# Patient Record
Sex: Female | Born: 1970 | Race: Black or African American | Marital: Single | Smoking: Never smoker
Health system: Southern US, Community
[De-identification: ages and names within clinical notes are randomized; demographics above are authoritative.]

## PROBLEM LIST (undated history)

## (undated) DIAGNOSIS — T7840XA Allergy, unspecified, initial encounter: Secondary | ICD-10-CM

## (undated) DIAGNOSIS — M1990 Unspecified osteoarthritis, unspecified site: Secondary | ICD-10-CM

## (undated) DIAGNOSIS — F419 Anxiety disorder, unspecified: Secondary | ICD-10-CM

## (undated) DIAGNOSIS — F32A Depression, unspecified: Secondary | ICD-10-CM

## (undated) DIAGNOSIS — J45909 Unspecified asthma, uncomplicated: Secondary | ICD-10-CM

## (undated) DIAGNOSIS — J302 Other seasonal allergic rhinitis: Secondary | ICD-10-CM

## (undated) HISTORY — PX: LEEP: SHX91

## (undated) HISTORY — DX: Depression, unspecified: F32.A

## (undated) HISTORY — DX: Allergy, unspecified, initial encounter: T78.40XA

## (undated) HISTORY — DX: Unspecified osteoarthritis, unspecified site: M19.90

## (undated) HISTORY — PX: LAPAROSCOPIC GASTRIC SLEEVE RESECTION: SHX5895

## (undated) HISTORY — PX: CERVICAL FUSION: SHX112

## (undated) HISTORY — DX: Anxiety disorder, unspecified: F41.9

## (undated) HISTORY — PX: WISDOM TOOTH EXTRACTION: SHX21

---

## 2013-04-29 ENCOUNTER — Encounter: Payer: Self-pay | Admitting: Physical Medicine & Rehabilitation

## 2013-05-09 ENCOUNTER — Encounter (HOSPITAL_COMMUNITY): Payer: Self-pay | Admitting: Emergency Medicine

## 2013-05-09 ENCOUNTER — Emergency Department (HOSPITAL_COMMUNITY): Payer: Medicare Other

## 2013-05-09 ENCOUNTER — Emergency Department (HOSPITAL_COMMUNITY)
Admission: EM | Admit: 2013-05-09 | Discharge: 2013-05-09 | Disposition: A | Discharge status: Home / Self Care | Payer: Medicare Other | Attending: Emergency Medicine | Admitting: Emergency Medicine

## 2013-05-09 DIAGNOSIS — N938 Other specified abnormal uterine and vaginal bleeding: Secondary | ICD-10-CM | POA: Insufficient documentation

## 2013-05-09 DIAGNOSIS — R109 Unspecified abdominal pain: Secondary | ICD-10-CM

## 2013-05-09 DIAGNOSIS — J309 Allergic rhinitis, unspecified: Secondary | ICD-10-CM | POA: Insufficient documentation

## 2013-05-09 DIAGNOSIS — R1031 Right lower quadrant pain: Secondary | ICD-10-CM | POA: Insufficient documentation

## 2013-05-09 DIAGNOSIS — Z3202 Encounter for pregnancy test, result negative: Secondary | ICD-10-CM | POA: Insufficient documentation

## 2013-05-09 DIAGNOSIS — N949 Unspecified condition associated with female genital organs and menstrual cycle: Secondary | ICD-10-CM | POA: Insufficient documentation

## 2013-05-09 DIAGNOSIS — J45909 Unspecified asthma, uncomplicated: Secondary | ICD-10-CM | POA: Insufficient documentation

## 2013-05-09 DIAGNOSIS — Z79899 Other long term (current) drug therapy: Secondary | ICD-10-CM | POA: Insufficient documentation

## 2013-05-09 DIAGNOSIS — Z882 Allergy status to sulfonamides status: Secondary | ICD-10-CM | POA: Insufficient documentation

## 2013-05-09 DIAGNOSIS — R112 Nausea with vomiting, unspecified: Secondary | ICD-10-CM | POA: Insufficient documentation

## 2013-05-09 HISTORY — DX: Unspecified asthma, uncomplicated: J45.909

## 2013-05-09 HISTORY — DX: Other seasonal allergic rhinitis: J30.2

## 2013-05-09 LAB — CBC WITH DIFFERENTIAL/PLATELET
Eosinophils Relative: 1 % (ref 0–5)
Hemoglobin: 12.7 g/dL (ref 12.0–15.0)
Lymphocytes Relative: 14 % (ref 12–46)
Lymphs Abs: 1 10*3/uL (ref 0.7–4.0)
MCH: 27.2 pg (ref 26.0–34.0)
MCV: 82.4 fL (ref 78.0–100.0)
Monocytes Relative: 7 % (ref 3–12)
Neutrophils Relative %: 78 % — ABNORMAL HIGH (ref 43–77)
Platelets: 224 10*3/uL (ref 150–400)
RBC: 4.67 MIL/uL (ref 3.87–5.11)
WBC: 7 10*3/uL (ref 4.0–10.5)

## 2013-05-09 LAB — URINALYSIS, ROUTINE W REFLEX MICROSCOPIC
Ketones, ur: 15 mg/dL — AB
Nitrite: NEGATIVE
Protein, ur: 30 mg/dL — AB
Urobilinogen, UA: 1 mg/dL (ref 0.0–1.0)

## 2013-05-09 LAB — COMPREHENSIVE METABOLIC PANEL
ALT: 14 U/L (ref 0–35)
Alkaline Phosphatase: 59 U/L (ref 39–117)
BUN: 10 mg/dL (ref 6–23)
CO2: 24 mEq/L (ref 19–32)
GFR calc Af Amer: >90 mL/min (ref >90)
GFR calc non Af Amer: 88 mL/min — ABNORMAL LOW (ref >90)
Glucose, Bld: 100 mg/dL — ABNORMAL HIGH (ref 70–99)
Potassium: 3.9 mEq/L (ref 3.5–5.1)
Sodium: 138 mEq/L (ref 135–145)

## 2013-05-09 MED ORDER — SODIUM CHLORIDE 0.9 % IV BOLUS (SEPSIS)
1000.0000 mL | Freq: Once | INTRAVENOUS | Status: AC
  Administered 2013-05-09: 1000 mL via INTRAVENOUS

## 2013-05-09 NOTE — ED Provider Notes (Signed)
Medical screening examination/treatment/procedure(s) were performed by non-physician practitioner and as supervising physician I was immediately available for consultation/collaboration.  Dillinger Aston R. Garrie Woodin, MD 05/09/13 2348 

## 2013-05-09 NOTE — ED Provider Notes (Signed)
Care assumed from Dr. Anitra Lauth at shift change. Awaiting pelvic US to r/o any evidence of ovarian torsion. If [place] normal, patient discharged home with gyn/PCP f/u. 5:51 PM [place] normal. Patient stable for discharge home. Abdomen soft was very mild tenderness in right pelvic region. No rigidity or guarding. F/u with gyn. Return precautions discussed. Patient states understanding of plan and is agreeable.  Trevor Mace, PA-C 05/09/13 1753

## 2013-05-09 NOTE — ED Notes (Signed)
I gave the patient a warm blanket. 

## 2013-05-09 NOTE — ED Notes (Signed)
Pt c/o right lower quadrant abdominal pain with N/V. Pt reports vomited 6 times today. Pt reports decrease urine output today.

## 2013-05-09 NOTE — ED Provider Notes (Signed)
History    CSN: [id number] Arrival date & time 05/09/13  1233  First MD Initiated Contact with Patient 05/09/13 1247     Chief Complaint  Patient presents with  . Abdominal Pain    Right lower  . Emesis   (Consider location/radiation/quality/duration/timing/severity/associated sxs/prior Treatment) Patient is a 42 y.o. female presenting with abdominal pain and vomiting. The history is provided by the patient.  Abdominal Pain This is a new problem. The current episode started 6 to 12 hours ago. The problem occurs constantly. Progression since onset: almost completely resolved. Associated symptoms include abdominal pain. Pertinent negatives include no chest pain and no shortness of breath. Associated symptoms comments: No fever, diarrhea or constipation.  6 episodes of vomiting every few min.  Last emesis was about 1 hr pta. Nothing aggravates the symptoms. Nothing relieves the symptoms. She has tried nothing for the symptoms. The treatment provided significant relief.  Emesis Associated symptoms: abdominal pain   Associated symptoms: no diarrhea    Past Medical History  Diagnosis Date  . Asthma    Past Surgical History  Procedure Laterality Date  . Laparoscopic gastric sleeve resection    . Cervical fusion     No family history on file. History  Substance Use Topics  . Smoking status: Never Smoker   . Smokeless tobacco: Not on file  . Alcohol Use: Yes   OB History   Grav Para Term Preterm Abortions TAB SAB Ect Mult Living                 Review of Systems  Constitutional: Negative for fever.  Respiratory: Negative for shortness of breath.   Cardiovascular: Negative for chest pain.  Gastrointestinal: Positive for nausea, vomiting and abdominal pain. Negative for diarrhea, constipation and blood in stool.  Genitourinary: Positive for vaginal bleeding. Negative for dysuria and menstrual problem.  All other systems reviewed and are negative.    Allergies  Sulfa  antibiotics  Home Medications  No current outpatient prescriptions on file. BP 115/83  Pulse 67  Temp(Src) 98.3 F (36.8 C) (Oral)  Resp 20  Ht 5' 6.5" (1.689 m)  Wt 183 lb (83.008 kg)  BMI 29.1 kg/m2  SpO2 100%  LMP 05/06/2013 Physical Exam  Nursing note and vitals reviewed. Constitutional: She is oriented to person, place, and time. She appears well-developed and well-nourished. No distress.  HENT:  Head: Normocephalic and atraumatic.  Mouth/Throat: Oropharynx is clear and moist.  Eyes: Conjunctivae and EOM are normal. Pupils are equal, round, and reactive to light.  Neck: Normal range of motion. Neck supple.  Cardiovascular: Normal rate, regular rhythm and intact distal pulses.   No murmur heard. Pulmonary/Chest: Effort normal and breath sounds normal. No respiratory distress. She has no wheezes. She has no rales.  Abdominal: Soft. Normal appearance. She exhibits no distension. There is tenderness. There is no rebound and no guarding.    Mild tenderness in the right pelvic region  Genitourinary: Uterus normal. Cervix exhibits no motion tenderness, no discharge and no friability. Right adnexum displays tenderness. Right adnexum displays no mass and no fullness. Left adnexum displays no mass, no tenderness and no fullness. There is bleeding around the vagina.  Musculoskeletal: Normal range of motion. She exhibits no edema and no tenderness.  Neurological: She is alert and oriented to person, place, and time.  Skin: Skin is warm and dry. No rash noted. No erythema.  Psychiatric: She has a normal mood and affect. Her behavior is normal.  ED Course  Procedures (including critical care time) Labs Reviewed  CBC WITH DIFFERENTIAL - Abnormal; Notable for the following:    Neutrophils Relative % 78 (*)    All other components within normal limits  COMPREHENSIVE METABOLIC PANEL - Abnormal; Notable for the following:    Glucose, Bld 100 (*)    GFR calc non Af Amer 88 (*)    All  other components within normal limits  URINALYSIS, ROUTINE W REFLEX MICROSCOPIC  POCT PREGNANCY, URINE   No results found. No diagnosis found.  MDM   Patient presents to 2 right lower quadrant pain that was severe earlier today with vomiting and is now almost completely resolved. Denies fever or bowel symptoms. Patient has a prior history of a gastric sleeve with no complications and has done well for the last several years but no other abdominal surgeries. Pelvic exam with tenderness over the right ovary a.m. moderate amount of blood in the vaginal vault the patient is currently on menses. Feel most likely the patient's symptoms. Cystoscopy possible intermittent torsion but low concern for appendicitis at this time his pain is almost completely resolved and she displays no symptoms concerning for perforated appendicitis. Decreased bowel sounds are to an acute abdominal series to rule out obstruction. CBC and CMP within normal limits. Urine pregnancy negative.  Patient given IV fluids but currently is not requiring any medication for pain.  Gwyneth Sprout, MD 05/10/13 [id number]

## 2013-06-21 ENCOUNTER — Ambulatory Visit (HOSPITAL_BASED_OUTPATIENT_CLINIC_OR_DEPARTMENT_OTHER): Payer: Medicare Other | Admitting: Physical Medicine & Rehabilitation

## 2013-06-21 ENCOUNTER — Encounter: Payer: Self-pay | Admitting: Physical Medicine & Rehabilitation

## 2013-06-21 ENCOUNTER — Encounter: Payer: Medicare Other | Attending: Physical Medicine & Rehabilitation

## 2013-06-21 VITALS — BP 115/75 | HR 84 | Resp 14 | Ht 66.0 in | Wt 194.0 lb

## 2013-06-21 DIAGNOSIS — M961 Postlaminectomy syndrome, not elsewhere classified: Secondary | ICD-10-CM | POA: Insufficient documentation

## 2013-06-21 DIAGNOSIS — Z981 Arthrodesis status: Secondary | ICD-10-CM | POA: Insufficient documentation

## 2013-06-21 DIAGNOSIS — Z0000 Encounter for general adult medical examination without abnormal findings: Secondary | ICD-10-CM | POA: Insufficient documentation

## 2013-06-21 DIAGNOSIS — R259 Unspecified abnormal involuntary movements: Secondary | ICD-10-CM | POA: Insufficient documentation

## 2013-06-21 DIAGNOSIS — M542 Cervicalgia: Secondary | ICD-10-CM | POA: Insufficient documentation

## 2013-06-21 DIAGNOSIS — G243 Spasmodic torticollis: Secondary | ICD-10-CM

## 2013-06-21 NOTE — Progress Notes (Signed)
Subjective:    Patient ID: Christine Palmer, female    DOB: Jun 29, 1971, 42 y.o.   MRN: [id number]  HPI Chief complaint is neck pain History greater than 6 year history of neck pain. Underwent anterior cervical decompression and fusion August 8 C4-C7 levels. Postoperatively developed torticollis. Was under the care of Dr. Karren Burly at the [facility] [place], [place].  Has undergone botulinum toxin injections on multiple occasions starting in 2009. Last injection was July of 2014. Has received a total of 15 injections. Last injection included the right semisoft by mouth At this 50 units rights plan he is This 20 units right knee greater scapula 30 units left trapezius 75 units left minus This 50 units left levator scapula 75 units.  Patient indicates good results with this injection protocol  Post procedure occasionally with pain. Has had to use tramadol on occasion for pain but not regular basis. Takes Zanaflex 4 mg 3 times per day  PMH: Morbid Obesity s/p gastric sleeve procedure  Social: Moved to [place] with mother to be near sister Pain Inventory Average Pain 6 Pain Right Now 4 My pain is constant, dull and aching  In the last 24 hours, has pain interfered with the following? General activity 6 Relation with others 2 Enjoyment of life 2 What TIME of day is your pain at its worst? constant Sleep (in general) Poor  Pain is worse with: sitting, standing and some activites Pain improves with: rest and medication Relief from Meds: 8  Mobility do you drive?  yes  Function disabled: date disabled 2008  Neuro/Psych weakness numbness tingling spasms  Prior Studies x-rays CT/MRI  Physicians involved in your care Any changes since last visit?  no   Family History  Problem Relation Age of Onset  . Heart disease Mother   . Hypertension Mother   . COPD Mother   . Alcohol abuse Father   . Stroke Father   . Hypertension Father   . Diabetes Father     History   Social History  . Marital Status: Single    Spouse Name: N/A    Number of Children: N/A  . Years of Education: N/A   Social History Main Topics  . Smoking status: Never Smoker   . Smokeless tobacco: None  . Alcohol Use: Yes  . Drug Use: No  . Sexual Activity: None   Other Topics Concern  . None   Social History Narrative  . None   Past Surgical History  Procedure Laterality Date  . Laparoscopic gastric sleeve resection    . Cervical fusion     Past Medical History  Diagnosis Date  . Asthma   . Seasonal allergies    BP 115/75  Pulse 84  Resp 14  Ht 5\' 6"  (1.676 m)  Wt 194 lb (87.998 kg)  BMI 31.33 kg/m2  SpO2 100%  LMP 06/07/2013     Review of Systems  Neurological: Positive for weakness and numbness.       Tingling, spasm  All other systems reviewed and are negative.       Objective:   Physical Exam  Nursing note and vitals reviewed. Constitutional: She is oriented to person, place, and time. She appears well-developed and well-nourished.  HENT:  Head: Normocephalic and atraumatic.  Eyes: Conjunctivae and EOM are normal. Pupils are equal, round, and reactive to light.  Musculoskeletal:       Cervical back: She exhibits decreased range of motion.  No tenderness to palpation along  the cervical paraspinal or para scapular muscle groups  Neurological: She is alert and oriented to person, place, and time. She has normal strength and normal reflexes. She displays no atrophy and no tremor. A sensory deficit is present. She exhibits abnormal muscle tone. She displays a negative Romberg sign. Gait normal.  Decreased sensation left second third and fourth digit To light Decreased left C5 and C6 dermatome to pinprick  Reduced cervical range of motion approximately 0 25% for flexion extension lateral rotation and bending.  At rest 5 tilt lateralcollis to right side   Psychiatric: She has a normal mood and affect.          Assessment &  Plan:  1. 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 Has responded well to botulinum toxin injections at a frequency of every 3 months. Doses have been stable over the past 4 years. We discussed that I would continue to follow for the Zanaflex 4 mg 3 times per day We can also prescribed as needed tramadol and Valium  Will set up next cervical injection the end of September.Not prior to 9/25  2. 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 Establishing medical care, patient is new to this area, made referral for primary care physician. She prefers female physician.

## 2013-06-21 NOTE — Patient Instructions (Signed)
Dr. Asencion Partridge is the physician we have referred you to.

## 2013-07-20 DIAGNOSIS — F339 Major depressive disorder, recurrent, unspecified: Secondary | ICD-10-CM | POA: Insufficient documentation

## 2013-07-20 DIAGNOSIS — J309 Allergic rhinitis, unspecified: Secondary | ICD-10-CM | POA: Insufficient documentation

## 2013-07-20 DIAGNOSIS — Z9889 Other specified postprocedural states: Secondary | ICD-10-CM | POA: Insufficient documentation

## 2013-07-30 ENCOUNTER — Encounter: Payer: Medicare Other | Attending: Physical Medicine & Rehabilitation

## 2013-07-30 ENCOUNTER — Encounter: Payer: Self-pay | Admitting: Physical Medicine & Rehabilitation

## 2013-07-30 ENCOUNTER — Ambulatory Visit (HOSPITAL_BASED_OUTPATIENT_CLINIC_OR_DEPARTMENT_OTHER): Payer: Medicare Other | Admitting: Physical Medicine & Rehabilitation

## 2013-07-30 VITALS — BP 109/69 | HR 70 | Resp 14 | Ht 67.0 in | Wt 199.6 lb

## 2013-07-30 DIAGNOSIS — G243 Spasmodic torticollis: Secondary | ICD-10-CM

## 2013-07-30 DIAGNOSIS — M961 Postlaminectomy syndrome, not elsewhere classified: Secondary | ICD-10-CM | POA: Insufficient documentation

## 2013-07-30 DIAGNOSIS — Z981 Arthrodesis status: Secondary | ICD-10-CM | POA: Insufficient documentation

## 2013-07-30 DIAGNOSIS — M542 Cervicalgia: Secondary | ICD-10-CM | POA: Insufficient documentation

## 2013-07-30 DIAGNOSIS — R259 Unspecified abnormal involuntary movements: Secondary | ICD-10-CM | POA: Insufficient documentation

## 2013-07-30 MED ORDER — TIZANIDINE HCL 4 MG PO TABS: 4.0000 mg | ORAL_TABLET | Freq: Two times a day (BID) | ORAL | Status: DC

## 2013-07-30 NOTE — Patient Instructions (Signed)

## 2013-07-30 NOTE — Progress Notes (Signed)
Diagnosis: Cervical dystonia Procedure: Botulinum toxin injection under needle EMG guidance Indication: Cervical dystonia and pain which has been relieved for greater than 3 months with previous botulinum toxin injections.  Informed consent was obtained after describing risks and benefits of the procedure with the patient. This includes bleeding bruising infection swallowing disorder as well as allergy or intolerance to botulinum toxin. She elects to proceed and has given written consent. RE MS form reviewed and signed  Dilution 50 units per cc on the left side 25 units per cc on the right side  300 Units total injected  Right Semispinalis capitus 50 units into 4 sites RIght Splenius Capitus 25 units into 2 sites Right Levator scapulae 25 units into 2 sites  Left Trapezius 75 Units into 3 sites Left Levator scapulae 75 Units into 3 sites Left Splenius capitus 50 units into 2 sites  Patient tolerated procedure well Post procedure instructions given Return to clinic 6 weeks for reevaluation

## 2013-08-06 ENCOUNTER — Telehealth: Payer: Self-pay

## 2013-08-06 MED ORDER — TIZANIDINE HCL 4 MG PO TABS: 4.0000 mg | ORAL_TABLET | Freq: Two times a day (BID) | ORAL | Status: DC

## 2013-08-06 NOTE — Telephone Encounter (Signed)
Patient called requesting zanaflex be sent to [facility] not [facility].  New script was escribed.  Patient aware.

## 2013-09-06 ENCOUNTER — Encounter: Payer: Medicare Other | Attending: Physical Medicine & Rehabilitation

## 2013-09-06 ENCOUNTER — Ambulatory Visit (HOSPITAL_BASED_OUTPATIENT_CLINIC_OR_DEPARTMENT_OTHER): Payer: Medicare Other | Admitting: Physical Medicine & Rehabilitation

## 2013-09-06 ENCOUNTER — Encounter: Payer: Self-pay | Admitting: Physical Medicine & Rehabilitation

## 2013-09-06 VITALS — BP 108/75 | HR 70 | Resp 14 | Ht 66.5 in | Wt 197.6 lb

## 2013-09-06 DIAGNOSIS — M961 Postlaminectomy syndrome, not elsewhere classified: Secondary | ICD-10-CM

## 2013-09-06 DIAGNOSIS — G243 Spasmodic torticollis: Secondary | ICD-10-CM

## 2013-09-06 DIAGNOSIS — Z981 Arthrodesis status: Secondary | ICD-10-CM | POA: Insufficient documentation

## 2013-09-06 DIAGNOSIS — R259 Unspecified abnormal involuntary movements: Secondary | ICD-10-CM | POA: Insufficient documentation

## 2013-09-06 DIAGNOSIS — M542 Cervicalgia: Secondary | ICD-10-CM | POA: Insufficient documentation

## 2013-09-06 NOTE — Patient Instructions (Signed)
Next appt for repeat injection Continue neck Range of motion exercise

## 2013-09-06 NOTE — Progress Notes (Signed)
Subjective:    Patient ID: Christine Palmer, female    DOB: 22-Jun-1971, 42 y.o.   MRN: [id number]  HPI 07/30/2013 Botox injection with following treatment  Dilution 50 units per cc on the left side 25 units per cc on the right side  300 Units total injected  Right Semispinalis capitus 50 units into 4 sites RIght Splenius Capitus 25 units into 2 sites Right Levator scapulae 25 units into 2 sites  Left Trapezius 75 Units into 3 sites Left Levator scapulae 75 Units into 3 sites Left Splenius capitus 50 units into 2 sites  Patient is able to turn her head better. Pain levels are reduced. Last set of injections were as helpful as previous once.   Pain Inventory Average Pain 6 Pain Right Now 5 My pain is constant, dull, tingling and aching  In the last 24 hours, has pain interfered with the following? General activity 3 Relation with others 0 Enjoyment of life 3 What TIME of day is your pain at its worst? morning, night Sleep (in general) Fair  Pain is worse with: bending, sitting, standing and some activites Pain improves with: rest, pacing activities and medication Relief from Meds: 5  Mobility walk without assistance ability to climb steps?  yes do you drive?  yes Do you have any goals in this area?  no  Function disabled: date disabled 2010 Do you have any goals in this area?  no  Neuro/Psych weakness numbness tingling spasms depression anxiety  Prior Studies Any changes since last visit?  no  Physicians involved in your care Any changes since last visit?  no   Family History  Problem Relation Age of Onset  . Heart disease Mother   . Hypertension Mother   . COPD Mother   . Alcohol abuse Father   . Stroke Father   . Hypertension Father   . Diabetes Father    History   Social History  . Marital Status: Single    Spouse Name: N/A    Number of Children: N/A  . Years of Education: N/A   Social History Main Topics  . Smoking status: Never  Smoker   . Smokeless tobacco: None  . Alcohol Use: Yes  . Drug Use: No  . Sexual Activity: None   Other Topics Concern  . None   Social History Narrative  . None   Past Surgical History  Procedure Laterality Date  . Laparoscopic gastric sleeve resection    . Cervical fusion     Past Medical History  Diagnosis Date  . Asthma   . Seasonal allergies    BP 108/75  Pulse 70  Resp 14  Ht 5' 6.5" (1.689 m)  Wt 197 lb 9.6 oz (89.631 kg)  BMI 31.42 kg/m2  SpO2 100%  LMP 08/30/2013      Review of Systems  Musculoskeletal: Positive for neck pain.  Neurological: Positive for weakness and numbness.       Spasms  Psychiatric/Behavioral: Positive for dysphoric mood. The patient is nervous/anxious.   All other systems reviewed and are negative.       Objective:   Physical Exam   Head: Normocephalic and atraumatic.  Eyes: Conjunctivae and EOM are normal. Pupils are equal, round, and reactive to light.  Musculoskeletal:       Cervical back: She exhibits decreased range of motion.  Mild tenderness to palpation along the cervical paraspinal or para scapular muscle groups  Neurological: She is alert and oriented to person, place, and  time. She has normal strength and normal reflexes. She displays no atrophy and no tremor. A sensory deficit is present. She exhibits abnormal muscle tone. She displays a negative Romberg sign. Gait normal.  Decreased sensation left second third and fourth digit To light Decreased left C5 and C6 dermatome to pinprick  Reduced cervical range of motion approximately 0 25% for flexion extension lateral rotation and bending.  At rest NO  lateralcollis to right side     Assessment & Plan:  1. 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 Has responded well to botulinum toxin injections at a frequency of every 3 months. Doses have been stable over the past 4 years. We discussed that I  would continue to follow for the Zanaflex 4 mg 3 times per day We can also prescribed as needed tramadol and Valium  Will set up next cervical injection the end of december.  2. 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

## 2013-10-14 ENCOUNTER — Telehealth: Payer: Self-pay

## 2013-10-14 NOTE — Telephone Encounter (Signed)
Not sure what kind of form.  I don't think I can justify a parking tag

## 2013-10-14 NOTE — Telephone Encounter (Signed)
Patient is requesting a handicap form filled out.

## 2013-10-15 NOTE — Telephone Encounter (Signed)
Patient says she has had a handicap tag for some time now due to her disabilities, she request reconsideration.  Please advise.

## 2013-10-15 NOTE — Telephone Encounter (Signed)
From my records I do not see a reason. I can re examine her next visit

## 2013-10-15 NOTE — Telephone Encounter (Signed)
Patient informed we would not give handicap tag.

## 2013-11-02 ENCOUNTER — Encounter: Payer: Self-pay | Admitting: Physical Medicine & Rehabilitation

## 2013-11-02 ENCOUNTER — Ambulatory Visit (HOSPITAL_BASED_OUTPATIENT_CLINIC_OR_DEPARTMENT_OTHER): Payer: Medicare Other | Admitting: Physical Medicine & Rehabilitation

## 2013-11-02 ENCOUNTER — Encounter: Payer: Medicare Other | Attending: Physical Medicine & Rehabilitation

## 2013-11-02 VITALS — BP 124/78 | HR 86 | Resp 14 | Ht 66.5 in | Wt 199.0 lb

## 2013-11-02 DIAGNOSIS — G243 Spasmodic torticollis: Secondary | ICD-10-CM

## 2013-11-02 DIAGNOSIS — M961 Postlaminectomy syndrome, not elsewhere classified: Secondary | ICD-10-CM | POA: Insufficient documentation

## 2013-11-02 DIAGNOSIS — Z981 Arthrodesis status: Secondary | ICD-10-CM | POA: Insufficient documentation

## 2013-11-02 DIAGNOSIS — R259 Unspecified abnormal involuntary movements: Secondary | ICD-10-CM | POA: Insufficient documentation

## 2013-11-02 DIAGNOSIS — M542 Cervicalgia: Secondary | ICD-10-CM | POA: Insufficient documentation

## 2013-11-02 NOTE — Progress Notes (Signed)
Diagnosis: Cervical dystonia Procedure: Botulinum toxin injection under needle EMG guidance Indication: Cervical dystonia and pain which has been relieved for greater than 3 months with previous botulinum toxin injections.  Informed consent was obtained after describing risks and benefits of the procedure with the patient. This includes bleeding bruising infection swallowing disorder as well as allergy or intolerance to botulinum toxin. She elects to proceed and has given written consent. RE MS form reviewed and signed  Dilution 50 units per cc   300 Units total injected  Right Semispinalis capitus 50 units into 4 sites RIght Splenius Capitus 25 units into 2 sites Right Levator scapulae 25 units into 2 sites  Left Trapezius 75 Units into 3 sites Left Levator scapulae 75 Units into 3 sites Left Splenius capitus 50 units into 2 sites  Patient tolerated procedure well Post procedure instructions given Return to clinic 6 weeks for reevaluation 

## 2013-11-02 NOTE — Patient Instructions (Signed)
My treatments are limited to the neck and no restrictions would qualify you for a handicapped parking sticker

## 2014-01-18 ENCOUNTER — Other Ambulatory Visit: Payer: Self-pay | Admitting: Orthopaedic Surgery

## 2014-01-18 DIAGNOSIS — M545 Low back pain, unspecified: Secondary | ICD-10-CM

## 2014-01-26 ENCOUNTER — Ambulatory Visit
Admission: RE | Admit: 2014-01-26 | Discharge: 2014-01-26 | Disposition: A | Discharge status: Home / Self Care | Payer: Medicare Other | Source: Ambulatory Visit | Attending: Orthopaedic Surgery | Admitting: Orthopaedic Surgery

## 2014-01-26 DIAGNOSIS — M545 Low back pain, unspecified: Secondary | ICD-10-CM

## 2014-01-27 ENCOUNTER — Ambulatory Visit (HOSPITAL_BASED_OUTPATIENT_CLINIC_OR_DEPARTMENT_OTHER): Payer: Medicare Other | Admitting: Physical Medicine & Rehabilitation

## 2014-01-27 ENCOUNTER — Encounter: Payer: Self-pay | Admitting: Physical Medicine & Rehabilitation

## 2014-01-27 ENCOUNTER — Encounter: Payer: Medicare Other | Attending: Physical Medicine & Rehabilitation

## 2014-01-27 VITALS — BP 119/80 | HR 79 | Resp 14 | Ht 67.0 in | Wt 204.6 lb

## 2014-01-27 DIAGNOSIS — R259 Unspecified abnormal involuntary movements: Secondary | ICD-10-CM | POA: Insufficient documentation

## 2014-01-27 DIAGNOSIS — G243 Spasmodic torticollis: Secondary | ICD-10-CM

## 2014-01-27 DIAGNOSIS — Z981 Arthrodesis status: Secondary | ICD-10-CM | POA: Insufficient documentation

## 2014-01-27 DIAGNOSIS — M961 Postlaminectomy syndrome, not elsewhere classified: Secondary | ICD-10-CM | POA: Insufficient documentation

## 2014-01-27 DIAGNOSIS — M542 Cervicalgia: Secondary | ICD-10-CM | POA: Insufficient documentation

## 2014-01-27 NOTE — Patient Instructions (Signed)
OnabotulinumtoxinA injection (Medical Use) What is this medicine? ONABOTULINUMTOXINA (o na BOTT you lye num tox in eh) is a neuro-muscular blocker. This medicine is used to treat crossed eyes, eyelid spasms, severe neck muscle spasms, and elbow, wrist, and finger muscle spasms. It is also used to treat excessive underarm sweating, to prevent chronic migraine headaches, and to treat loss of bladder control due to neurologic conditions such as multiple sclerosis or spinal cord injury. This medicine may be used for other purposes; ask your health care provider or pharmacist if you have questions. COMMON BRAND NAME(S): Botox What should I tell my health care provider before I take this medicine? They need to know if you have any of these conditions: -breathing problems -cerebral palsy spasms -difficulty urinating -heart problems -history of surgery where this medicine is going to be used -infection at the site where this medicine is going to be used -myasthenia gravis or other neurologic disease -nerve or muscle disease -surgery plans -take medicines that treat or prevent blood clots -thyroid problems -an unusual or allergic reaction to botulinum toxin, albumin, other medicines, foods, dyes, or preservatives -pregnant or trying to get pregnant -breast-feeding How should I use this medicine? This medicine is for injection into a muscle. It is given by a health care professional in a hospital or clinic setting. Talk to your pediatrician regarding the use of this medicine in children. While this drug may be prescribed for children as young as 55 years old for selected conditions, precautions do apply. Overdosage: If you think you have taken too much of this medicine contact a poison control center or emergency room at once. NOTE: This medicine is only for you. Do not share this medicine with others. What if I miss a dose? This does not apply. What may interact with this  medicine? -aminoglycoside antibiotics like gentamicin, neomycin, tobramycin -muscle relaxants -other botulinum toxin injections This list may not describe all possible interactions. Give your health care provider a list of all the medicines, herbs, non-prescription drugs, or dietary supplements you use. Also tell them if you smoke, drink alcohol, or use illegal drugs. Some items may interact with your medicine. What should I watch for while using this medicine? Visit your doctor for regular check ups. This medicine will cause weakness in the muscle where it is injected. Tell your doctor if you feel unusually weak in other muscles. Get medical help right away if you have problems with breathing, swallowing, or talking. This medicine might make your eyelids droop or make you see blurry or double. If you have weak muscles or trouble seeing do not drive a car, use machinery, or do other dangerous activities. This medicine contains albumin from human blood. It may be possible to pass an infection in this medicine, but no cases have been reported. Talk to your doctor about the risks and benefits of this medicine. If your activities have been limited by your condition, go back to your regular routine slowly after treatment with this medicine. What side effects may I notice from receiving this medicine? Side effects that you should report to your doctor or health care professional as soon as possible: -allergic reactions like skin rash, itching or hives, swelling of the face, lips, or tongue -breathing problems -changes in vision -chest pain or tightness -eye irritation, pain -fast, irregular heartbeat -infection -numbness -speech problems -swallowing problems -unusual weakness Side effects that usually do not require medical attention (report to your doctor or health care professional if they continue or  are bothersome): -bruising or pain at site where injected -drooping eyelid -dry eyes or  mouth -headache -muscles aches, pains -sensitivity to light -tearing This list may not describe all possible side effects. Call your doctor for medical advice about side effects. You may report side effects to FDA at 1-800-FDA-1088. Where should I keep my medicine? This drug is given in a hospital or clinic and will not be stored at home. NOTE: This sheet is a summary. It may not cover all possible information. If you have questions about this medicine, talk to your doctor, pharmacist, or health care provider.  2014, [employer] Standard. (2012-07-20 17:30:24)

## 2014-01-27 NOTE — Progress Notes (Signed)
Diagnosis: Cervical dystonia Procedure: Botulinum toxin injection under needle EMG guidance Indication: Cervical dystonia and pain which has been relieved for greater than 3 months with previous botulinum toxin injections.  Informed consent was obtained after describing risks and benefits of the procedure with the patient. This includes bleeding bruising infection swallowing disorder as well as allergy or intolerance to botulinum toxin. She elects to proceed and has given written consent. RE MS form reviewed and signed  Dilution 50 units per cc   300 Units total injected  Right Semispinalis capitus 50 units into 4 sites RIght Splenius Capitus 25 units into 2 sites Right Levator scapulae 25 units into 2 sites  Left Trapezius 75 Units into 3 sites Left Levator scapulae 75 Units into 3 sites Left Splenius capitus 50 units into 2 sites  Patient tolerated procedure well Post procedure instructions given Return to clinic 6 weeks for reevaluation 

## 2014-04-28 ENCOUNTER — Encounter: Payer: Medicare Other | Attending: Physical Medicine & Rehabilitation

## 2014-04-28 ENCOUNTER — Ambulatory Visit (HOSPITAL_BASED_OUTPATIENT_CLINIC_OR_DEPARTMENT_OTHER): Payer: Medicare Other | Admitting: Physical Medicine & Rehabilitation

## 2014-04-28 ENCOUNTER — Encounter: Payer: Self-pay | Admitting: Physical Medicine & Rehabilitation

## 2014-04-28 ENCOUNTER — Other Ambulatory Visit: Payer: Self-pay

## 2014-04-28 VITALS — BP 122/83 | HR 84 | Resp 14 | Ht 67.0 in | Wt 204.0 lb

## 2014-04-28 DIAGNOSIS — G243 Spasmodic torticollis: Secondary | ICD-10-CM | POA: Insufficient documentation

## 2014-04-28 DIAGNOSIS — M542 Cervicalgia: Secondary | ICD-10-CM | POA: Insufficient documentation

## 2014-04-28 NOTE — Progress Notes (Signed)
Diagnosis: Cervical dystonia Procedure: Botulinum toxin injection under needle EMG guidance Indication: Cervical dystonia and pain which has been relieved for greater than 3 months with previous botulinum toxin injections.  Informed consent was obtained after describing risks and benefits of the procedure with the patient. This includes bleeding bruising infection swallowing disorder as well as allergy or intolerance to botulinum toxin. She elects to proceed and has given written consent. RE MS form reviewed and signed  Dilution 50 units per cc   300 Units total injected  Right Semispinalis capitus 50 units into 4 sites RIght Splenius Capitus 25 units into 2 sites Right Levator scapulae 25 units into 2 sites  Left Trapezius 75 Units into 3 sites Left Levator scapulae 75 Units into 3 sites Left Splenius capitus 50 units into 2 sites  Patient tolerated procedure well Post procedure instructions given Return to clinic 6 weeks for reevaluation

## 2014-04-28 NOTE — Patient Instructions (Signed)

## 2014-08-08 ENCOUNTER — Ambulatory Visit: Payer: Medicare Other | Admitting: Physical Medicine & Rehabilitation

## 2014-08-23 ENCOUNTER — Encounter: Payer: Medicare Other | Attending: Physical Medicine & Rehabilitation

## 2014-08-23 ENCOUNTER — Ambulatory Visit (HOSPITAL_BASED_OUTPATIENT_CLINIC_OR_DEPARTMENT_OTHER): Payer: Medicare Other | Admitting: Physical Medicine & Rehabilitation

## 2014-08-23 ENCOUNTER — Encounter: Payer: Self-pay | Admitting: Physical Medicine & Rehabilitation

## 2014-08-23 VITALS — BP 122/78 | HR 83 | Resp 14 | Wt 211.6 lb

## 2014-08-23 DIAGNOSIS — G243 Spasmodic torticollis: Secondary | ICD-10-CM

## 2014-08-23 MED ORDER — TIZANIDINE HCL 4 MG PO TABS
4.0000 mg | ORAL_TABLET | Freq: Two times a day (BID) | ORAL | Status: DC
Start: 2014-08-23 — End: 2015-09-15

## 2014-08-23 NOTE — Progress Notes (Signed)
Diagnosis: Cervical dystonia Procedure: Botulinum toxin injection under needle EMG guidance Indication: Cervical dystonia and pain which has been relieved for greater than 3 months with previous botulinum toxin injections.  Informed consent was obtained after describing risks and benefits of the procedure with the patient. This includes bleeding bruising infection swallowing disorder as well as allergy or intolerance to botulinum toxin. She elects to proceed and has given written consent. RE MS form reviewed and signed  Dilution 50 units per cc   300 Units total injected  Right Semispinalis capitus 50 units into 4 sites RIght Splenius Capitus 25 units into 2 sites Right Levator scapulae 25 units into 2 sites  Left Trapezius 75 Units into 3 sites Left Levator scapulae 75 Units into 3 sites Left Splenius capitus 50 units into 2 sites  Patient tolerated procedure well Post procedure instructions given Return to clinic 17mo for repeat given stable doses with consistent results

## 2014-08-23 NOTE — Patient Instructions (Signed)
OnabotulinumtoxinA injection (Medical Use) What is this medicine? ONABOTULINUMTOXINA (o na BOTT you lye num tox in eh) is a neuro-muscular blocker. This medicine is used to treat crossed eyes, eyelid spasms, severe neck muscle spasms, and elbow, wrist, and finger muscle spasms. It is also used to treat excessive underarm sweating, to prevent chronic migraine headaches, and to treat loss of bladder control due to neurologic conditions such as multiple sclerosis or spinal cord injury. This medicine may be used for other purposes; ask your health care provider or pharmacist if you have questions. COMMON BRAND NAME(S): Botox What should I tell my health care provider before I take this medicine? They need to know if you have any of these conditions: -breathing problems -cerebral palsy spasms -difficulty urinating -heart problems -history of surgery where this medicine is going to be used -infection at the site where this medicine is going to be used -myasthenia gravis or other neurologic disease -nerve or muscle disease -surgery plans -take medicines that treat or prevent blood clots -thyroid problems -an unusual or allergic reaction to botulinum toxin, albumin, other medicines, foods, dyes, or preservatives -pregnant or trying to get pregnant -breast-feeding How should I use this medicine? This medicine is for injection into a muscle. It is given by a health care professional in a hospital or clinic setting. Talk to your pediatrician regarding the use of this medicine in children. While this drug may be prescribed for children as young as 12 years old for selected conditions, precautions do apply. Overdosage: If you think you have taken too much of this medicine contact a poison control center or emergency room at once. NOTE: This medicine is only for you. Do not share this medicine with others. What if I miss a dose? This does not apply. What may interact with this  medicine? -aminoglycoside antibiotics like gentamicin, neomycin, tobramycin -muscle relaxants -other botulinum toxin injections This list may not describe all possible interactions. Give your health care provider a list of all the medicines, herbs, non-prescription drugs, or dietary supplements you use. Also tell them if you smoke, drink alcohol, or use illegal drugs. Some items may interact with your medicine. What should I watch for while using this medicine? Visit your doctor for regular check ups. This medicine will cause weakness in the muscle where it is injected. Tell your doctor if you feel unusually weak in other muscles. Get medical help right away if you have problems with breathing, swallowing, or talking. This medicine might make your eyelids droop or make you see blurry or double. If you have weak muscles or trouble seeing do not drive a car, use machinery, or do other dangerous activities. This medicine contains albumin from human blood. It may be possible to pass an infection in this medicine, but no cases have been reported. Talk to your doctor about the risks and benefits of this medicine. If your activities have been limited by your condition, go back to your regular routine slowly after treatment with this medicine. What side effects may I notice from receiving this medicine? Side effects that you should report to your doctor or health care professional as soon as possible: -allergic reactions like skin rash, itching or hives, swelling of the face, lips, or tongue -breathing problems -changes in vision -chest pain or tightness -eye irritation, pain -fast, irregular heartbeat -infection -numbness -speech problems -swallowing problems -unusual weakness Side effects that usually do not require medical attention (report to your doctor or health care professional if they continue or   are bothersome): -bruising or pain at site where injected -drooping eyelid -dry eyes or  mouth -headache -muscles aches, pains -sensitivity to light -tearing This list may not describe all possible side effects. Call your doctor for medical advice about side effects. You may report side effects to FDA at 1-800-FDA-1088. Where should I keep my medicine? This drug is given in a hospital or clinic and will not be stored at home. NOTE: This sheet is a summary. It may not cover all possible information. If you have questions about this medicine, talk to your doctor, pharmacist, or health care provider.  2015, [employer]vier/Gold Standard. (2012-07-20 17:30:24)  

## 2014-12-22 ENCOUNTER — Ambulatory Visit: Payer: Medicare Other | Admitting: Physical Medicine & Rehabilitation

## 2015-02-13 ENCOUNTER — Encounter: Payer: Self-pay | Admitting: Physical Medicine & Rehabilitation

## 2015-02-13 ENCOUNTER — Ambulatory Visit (HOSPITAL_BASED_OUTPATIENT_CLINIC_OR_DEPARTMENT_OTHER): Payer: Medicare Other | Admitting: Physical Medicine & Rehabilitation

## 2015-02-13 ENCOUNTER — Encounter: Payer: Medicare Other | Attending: Physical Medicine & Rehabilitation

## 2015-02-13 VITALS — BP 125/78 | HR 80 | Resp 14

## 2015-02-13 DIAGNOSIS — G243 Spasmodic torticollis: Secondary | ICD-10-CM | POA: Insufficient documentation

## 2015-02-13 NOTE — Patient Instructions (Signed)

## 2015-02-13 NOTE — Progress Notes (Signed)
Diagnosis: Cervical dystonia Procedure: Botulinum toxin injection under needle EMG guidance Indication: Cervical dystonia and pain which has been relieved for greater than 3 months with previous botulinum toxin injections.  Informed consent was obtained after describing risks and benefits of the procedure with the patient. This includes bleeding bruising infection swallowing disorder as well as allergy or intolerance to botulinum toxin. She elects to proceed and has given written consent. RE MS form reviewed and signed  Dilution 50 units per cc   300 Units total injected  Right Semispinalis capitus 50 units into 4 sites RIght Splenius Capitus 25 units into 2 sites Right Levator scapulae 25 units into 2 sites  Left Trapezius 75 Units into 3 sites Left Levator scapulae 75 Units into 3 sites Left Splenius capitus 50 units into 2 sites  Patient tolerated procedure well Post procedure instructions given Return to clinic 12 weeks for repeat

## 2015-03-06 DIAGNOSIS — M5136 Other intervertebral disc degeneration, lumbar region: Secondary | ICD-10-CM | POA: Insufficient documentation

## 2015-03-06 DIAGNOSIS — M51369 Other intervertebral disc degeneration, lumbar region without mention of lumbar back pain or lower extremity pain: Secondary | ICD-10-CM | POA: Insufficient documentation

## 2015-05-15 ENCOUNTER — Encounter: Payer: Self-pay | Admitting: Physical Medicine & Rehabilitation

## 2015-05-15 ENCOUNTER — Encounter: Payer: Medicare Other | Attending: Physical Medicine & Rehabilitation

## 2015-05-15 ENCOUNTER — Ambulatory Visit (HOSPITAL_BASED_OUTPATIENT_CLINIC_OR_DEPARTMENT_OTHER): Payer: Medicare Other | Admitting: Physical Medicine & Rehabilitation

## 2015-05-15 VITALS — BP 114/80 | HR 73 | Resp 14

## 2015-05-15 DIAGNOSIS — G243 Spasmodic torticollis: Secondary | ICD-10-CM | POA: Diagnosis not present

## 2015-05-15 NOTE — Progress Notes (Signed)
   Subjective:    Patient ID: Christine Palmer, female    DOB: 1971-04-11, 44 y.o.   MRN: [id number]  HPI    Review of Systems    Diagnosis: Cervical dystonia Procedure: Botulinum toxin injection under needle EMG guidance Indication: Cervical dystonia and pain which has been relieved for greater than 3 months with previous botulinum toxin injections.  Informed consent was obtained after describing risks and benefits of the procedure with the patient. This includes bleeding bruising infection swallowing disorder as well as allergy or intolerance to botulinum toxin. She elects to proceed and has given written consent. RE MS form reviewed and signed  Dilution 50 units per cc   300 Units total injected  Right Semispinalis capitus 50 units into 4 sites RIght Splenius Capitus 25 units into 2 sites Right Levator scapulae 25 units into 2 sites  Left Trapezius 75 Units into 3 sites Left Levator scapulae 75 Units into 3 sites Left Splenius capitus 50 units into 2 sites  Patient tolerated procedure well Post procedure instructions given Return to clinic 12 weeks for repeat Objective:   Physical Exam        Assessment & Plan:

## 2015-05-15 NOTE — Progress Notes (Signed)
Diagnosis: Cervical dystonia Procedure: Botulinum toxin injection under needle EMG guidance Indication: Cervical dystonia and pain which has been relieved for greater than 3 months with previous botulinum toxin injections.  Informed consent was obtained after describing risks and benefits of the procedure with the patient. This includes bleeding bruising infection swallowing disorder as well as allergy or intolerance to botulinum toxin. She elects to proceed and has given written consent. RE MS form reviewed and signed  Dilution 50 units per cc   300 Units total injected  Right Semispinalis capitus 50 units into 4 sites RIght Splenius Capitus 25 units into 2 sites Right Levator scapulae 25 units into 2 sites  Left Trapezius 75 Units into 3 sites Left Levator scapulae 75 Units into 3 sites Left Splenius capitus 50 units into 2 sites  Patient tolerated procedure well Post procedure instructions given Return to clinic 12 weeks for repeat

## 2015-05-15 NOTE — Patient Instructions (Addendum)
You received a Botox injection today. You may experience soreness at the needle injection sites. Please call us if any of the injection sites turns red after a couple days or if there is any drainage. You may experience muscle weakness as a result of Botox. This would improve with time but can take several weeks to improve. The Botox should start working in about one week. The Botox usually last 3 months. The injection can be repeated every 3 months as needed.  DYSPORT is the other brand of botox that may last longer

## 2015-05-17 ENCOUNTER — Telehealth: Payer: Self-pay | Admitting: *Deleted

## 2015-05-17 NOTE — Telephone Encounter (Signed)
Christine Palmer called to let Dr Letta Pate know that since she received her Botox on Monday her arm is really numb and weak.

## 2015-05-18 NOTE — Telephone Encounter (Signed)
That is not a side effect of Botox but I can reevaluate her if this persists

## 2015-05-19 NOTE — Telephone Encounter (Signed)
I spoke with Christine Palmer. She says it has begun to dissipate.

## 2015-05-24 ENCOUNTER — Other Ambulatory Visit: Payer: Self-pay | Admitting: Surgery

## 2015-05-24 DIAGNOSIS — M545 Low back pain: Secondary | ICD-10-CM

## 2015-06-05 ENCOUNTER — Ambulatory Visit
Admission: RE | Admit: 2015-06-05 | Discharge: 2015-06-05 | Disposition: A | Discharge status: Home / Self Care | Payer: Medicare Other | Source: Ambulatory Visit | Attending: Surgery | Admitting: Surgery

## 2015-06-05 DIAGNOSIS — M545 Low back pain: Secondary | ICD-10-CM

## 2015-08-17 ENCOUNTER — Ambulatory Visit: Payer: Medicare Other | Admitting: Physical Medicine & Rehabilitation

## 2015-09-01 ENCOUNTER — Encounter: Payer: Medicare Other | Attending: Physical Medicine & Rehabilitation

## 2015-09-01 ENCOUNTER — Ambulatory Visit (HOSPITAL_BASED_OUTPATIENT_CLINIC_OR_DEPARTMENT_OTHER): Payer: Medicare Other | Admitting: Physical Medicine & Rehabilitation

## 2015-09-01 ENCOUNTER — Encounter: Payer: Self-pay | Admitting: Physical Medicine & Rehabilitation

## 2015-09-01 VITALS — BP 120/78 | HR 98 | Resp 14

## 2015-09-01 DIAGNOSIS — G243 Spasmodic torticollis: Secondary | ICD-10-CM | POA: Insufficient documentation

## 2015-09-01 NOTE — Patient Instructions (Signed)
As we discussed, a lower dose of Botox next visit. Did not see as much EMG activity which means not as much muscle contraction  In the left trapezius and the left levator muscles.

## 2015-09-01 NOTE — Progress Notes (Signed)
   Subjective:    Patient ID: Christine Palmer, female    DOB: 08-31-1971, 44 y.o.   MRN: [id number]    Diagnosis: Cervical dystonia Procedure: Botulinum toxin injection under needle EMG guidance Indication: Cervical dystonia and pain which has been relieved for greater than 3 months with previous botulinum toxin injections.  Informed consent was obtained after describing risks and benefits of the procedure with the patient. This includes bleeding bruising infection swallowing disorder as well as allergy or intolerance to botulinum toxin. She elects to proceed and has given written consent. RE MS form reviewed and signed  Dilution 50 units per cc   310 Units total injected  Right Semispinalis capitus 50 units into 4 sites RIght Splenius Capitus 25 units into 2 sites Right Levator scapulae 25 units into 2 sites  Left Trapezius 75 Units into 3 sites Left Levator scapulae 75 Units into 3 sites Left Splenius capitus 60 units into 2 sites  Patient tolerated procedure well Post procedure instructions given  Little EMG activity in left levator scapula as well as left trapezius Strong EMG activity left splenius capitis  90 units wasted  Objective:          Assessment & Plan:  Return to clinic 14 weeks for repeat  We will reduce left trapezius to 25 units We will reduce left levator to 0 units Increase left splenius Capitis 75 units Continue  Right Semispinalis capitus 50 units into 4 sites RIght Splenius Capitus 25 units into 2 sites Right Levator scapulae 25 units into 2 sites

## 2015-09-15 ENCOUNTER — Other Ambulatory Visit: Payer: Self-pay | Admitting: Physical Medicine & Rehabilitation

## 2015-12-12 ENCOUNTER — Ambulatory Visit (INDEPENDENT_AMBULATORY_CARE_PROVIDER_SITE_OTHER): Payer: Self-pay | Admitting: Neurology

## 2015-12-12 ENCOUNTER — Encounter: Payer: Self-pay | Admitting: Neurology

## 2015-12-12 ENCOUNTER — Ambulatory Visit (INDEPENDENT_AMBULATORY_CARE_PROVIDER_SITE_OTHER): Payer: Medicare Other | Admitting: Neurology

## 2015-12-12 DIAGNOSIS — M5442 Lumbago with sciatica, left side: Secondary | ICD-10-CM | POA: Diagnosis not present

## 2015-12-12 DIAGNOSIS — M961 Postlaminectomy syndrome, not elsewhere classified: Secondary | ICD-10-CM

## 2015-12-12 NOTE — Progress Notes (Signed)
Please refer to EMG and nerve conduction study procedure note. 

## 2015-12-12 NOTE — Procedures (Signed)
     HISTORY:  Christine Palmer is a 45 year old patient with a history of onset of low back pain in May 2016. Initially, she had bilateral hip pain, but she now has pain on down the left leg to the knee and ankle. She is being evaluated for these symptoms.  NERVE CONDUCTION STUDIES:  Nerve conduction studies were performed on both lower extremities. The distal motor latencies and motor amplitudes for the peroneal and posterior tibial nerves were within normal limits. The nerve conduction velocities for these nerves were also normal. The H reflex latencies were normal. The sensory latencies for the peroneal nerves were within normal limits.   EMG STUDIES:  EMG study was performed on the left lower extremity:  The tibialis anterior muscle reveals 2 to 4K motor units with full recruitment. No fibrillations or positive waves were seen. The peroneus tertius muscle reveals 2 to 4K motor units with full recruitment. No fibrillations or positive waves were seen. The medial gastrocnemius muscle reveals 1 to 3K motor units with full recruitment. No fibrillations or positive waves were seen. The vastus lateralis muscle reveals 2 to 4K motor units with full recruitment. No fibrillations or positive waves were seen. The iliopsoas muscle reveals 2 to 4K motor units with full recruitment. No fibrillations or positive waves were seen. The biceps femoris muscle (long head) reveals 2 to 4K motor units with full recruitment. No fibrillations or positive waves were seen. The lumbosacral paraspinal muscles were tested at 3 levels, and revealed no abnormalities of insertional activity at all 3 levels tested. There was good relaxation.   IMPRESSION:  Nerve conduction studies done on both lower extremities were within normal limits. No evidence of a peripheral neuropathy is seen. EMG evaluation of the left lower extremity was unremarkable, without evidence of an overlying lumbosacral radiculopathy.  Jill Alexanders MD 12/12/2015 2:50 PM  [facility] [address] [place], [place] [address]  Phone [phone] Fax [phone]

## 2015-12-15 ENCOUNTER — Ambulatory Visit: Payer: Medicare Other | Admitting: Physical Medicine & Rehabilitation

## 2016-01-05 ENCOUNTER — Encounter: Payer: Medicare Other | Attending: Physical Medicine & Rehabilitation

## 2016-01-05 ENCOUNTER — Ambulatory Visit (HOSPITAL_BASED_OUTPATIENT_CLINIC_OR_DEPARTMENT_OTHER): Payer: Medicare Other | Admitting: Physical Medicine & Rehabilitation

## 2016-01-05 ENCOUNTER — Encounter: Payer: Self-pay | Admitting: Physical Medicine & Rehabilitation

## 2016-01-05 VITALS — BP 120/79 | HR 78 | Resp 16

## 2016-01-05 DIAGNOSIS — G243 Spasmodic torticollis: Secondary | ICD-10-CM

## 2016-01-05 DIAGNOSIS — G249 Dystonia, unspecified: Secondary | ICD-10-CM | POA: Diagnosis present

## 2016-01-05 DIAGNOSIS — M542 Cervicalgia: Secondary | ICD-10-CM | POA: Insufficient documentation

## 2016-01-05 DIAGNOSIS — M6289 Other specified disorders of muscle: Secondary | ICD-10-CM | POA: Insufficient documentation

## 2016-01-05 NOTE — Progress Notes (Addendum)
Botox Injection for Cervical dystonia using needle EMG guidance  Dilution: 50 Units/ml Indication: Severe Cervical dystonia which interferes with neck motions, ADLs, causing pain Which is unrelieved by more conservative measures Informed consent was obtained after describing risks and benefits of the procedure with the patient. This includes bleeding, bruising, infection, excessive weakness, or medication side effects. A REMS form is on file and signed. Needle: 27g 1" needle electrode Number of units per muscle We will reduce left trapezius to 25 units We will reduce left levator to 0 units Increase left splenius Capitis 75 units Continue  Right Semispinalis capitus 50 units into 4 sites RIght Splenius Capitus 25 units into 2 sites Right Levator scapulae 25 units into 2 sites  All injections were done after obtaining appropriate EMG activity and after negative drawback for blood. The patient tolerated the procedure well. Post procedure instructions were given. A followup appointment was made.  Will plan same dosing and same muscles injected next visit

## 2016-04-05 ENCOUNTER — Ambulatory Visit: Payer: Medicare Other | Admitting: Physical Medicine & Rehabilitation

## 2016-04-12 ENCOUNTER — Ambulatory Visit: Payer: Medicare Other | Admitting: Physical Medicine & Rehabilitation

## 2016-04-19 ENCOUNTER — Encounter: Payer: Self-pay | Admitting: Physical Medicine & Rehabilitation

## 2016-04-19 ENCOUNTER — Ambulatory Visit (HOSPITAL_BASED_OUTPATIENT_CLINIC_OR_DEPARTMENT_OTHER): Payer: Medicare Other | Admitting: Physical Medicine & Rehabilitation

## 2016-04-19 ENCOUNTER — Encounter: Payer: Medicare Other | Attending: Physical Medicine & Rehabilitation

## 2016-04-19 VITALS — BP 117/83 | HR 71

## 2016-04-19 DIAGNOSIS — G243 Spasmodic torticollis: Secondary | ICD-10-CM | POA: Diagnosis not present

## 2016-04-19 DIAGNOSIS — G249 Dystonia, unspecified: Secondary | ICD-10-CM | POA: Diagnosis present

## 2016-04-19 DIAGNOSIS — M542 Cervicalgia: Secondary | ICD-10-CM | POA: Diagnosis present

## 2016-04-19 DIAGNOSIS — M6289 Other specified disorders of muscle: Secondary | ICD-10-CM | POA: Insufficient documentation

## 2016-04-19 NOTE — Progress Notes (Signed)
Diagnosis: Cervical dystonia Procedure: Botulinum toxin injection under needle EMG guidance Indication: Cervical dystonia and pain which has been relieved for greater than 3 months with previous botulinum toxin injections.  Informed consent was obtained after describing risks and benefits of the procedure with the patient. This includes bleeding bruising infection swallowing disorder as well as allergy or intolerance to botulinum toxin. She elects to proceed and has given written consent. RE MS form reviewed and signed  Dilution 50 units per cc   left trapezius to 25 units  left levator to 0 units  left splenius Capitis 25 units x 3   Right Semispinalis capitus 12.5 units x 4 sites RIght Splenius Capitus 12.5 units into 2 sites Right Levator scapulae 12.5 units into 2 sites   Patient tolerated procedure well Post procedure instructions given Return to clinic 6 weeks for Reassessment, has had lower dose will see whether this is as effective. Decisions made on basis of EMG findings

## 2016-05-30 ENCOUNTER — Ambulatory Visit: Payer: Medicare Other | Admitting: Physical Medicine & Rehabilitation

## 2016-06-14 ENCOUNTER — Ambulatory Visit (HOSPITAL_BASED_OUTPATIENT_CLINIC_OR_DEPARTMENT_OTHER): Payer: Medicare Other | Admitting: Physical Medicine & Rehabilitation

## 2016-06-14 ENCOUNTER — Encounter: Payer: Medicare Other | Attending: Physical Medicine & Rehabilitation

## 2016-06-14 ENCOUNTER — Encounter: Payer: Self-pay | Admitting: Physical Medicine & Rehabilitation

## 2016-06-14 VITALS — BP 117/83 | HR 71 | Resp 16

## 2016-06-14 DIAGNOSIS — G243 Spasmodic torticollis: Secondary | ICD-10-CM

## 2016-06-14 DIAGNOSIS — M961 Postlaminectomy syndrome, not elsewhere classified: Secondary | ICD-10-CM

## 2016-06-14 DIAGNOSIS — M6289 Other specified disorders of muscle: Secondary | ICD-10-CM | POA: Insufficient documentation

## 2016-06-14 DIAGNOSIS — G249 Dystonia, unspecified: Secondary | ICD-10-CM | POA: Diagnosis present

## 2016-06-14 DIAGNOSIS — M542 Cervicalgia: Secondary | ICD-10-CM | POA: Insufficient documentation

## 2016-06-14 NOTE — Patient Instructions (Signed)
Same botox dose next visit

## 2016-06-14 NOTE — Progress Notes (Signed)
Subjective:    Patient ID: Christine Palmer, female    DOB: 1971-07-07, 45 y.o.   MRN: [id number]  HPI Asking about feeling strange ~2hrs after taking zanaflex but this was only on occasion when she hasn't been sleeping well , up late.  Patient had good results with the last botulinum toxin injection. We discussed that her overall dosage has reduced from 300 units to 200 units over the last year.  Pain Inventory Average Pain 5 Pain Right Now 5 My pain is sharp, dull and aching  In the last 24 hours, has pain interfered with the following? General activity 5 Relation with others 5 Enjoyment of life 5 What TIME of day is your pain at its worst? morning, evening.  Sleep (in general) Poor  Pain is worse with: walking, sitting, standing and some activites Pain improves with: rest, pacing activities, medication and injections Relief from Meds: 5  Mobility ability to climb steps?  yes do you drive?  yes Do you have any goals in this area?  no  Function disabled: date disabled NA Do you have any goals in this area?  no  Neuro/Psych depression anxiety  Prior Studies Any changes since last visit?  no  Physicians involved in your care Primary care . Orthopedist . Psychologist ..   Family History  Problem Relation Age of Onset  . Heart disease Mother   . Hypertension Mother   . COPD Mother   . Alcohol abuse Father   . Stroke Father   . Hypertension Father   . Diabetes Father    Social History   Social History  . Marital status: Single    Spouse name: N/A  . Number of children: N/A  . Years of education: N/A   Social History Main Topics  . Smoking status: Never Smoker  . Smokeless tobacco: Never Used  . Alcohol use Yes  . Drug use: No  . Sexual activity: Not Asked   Other Topics Concern  . None   Social History Narrative  . None   Past Surgical History:  Procedure Laterality Date  . CERVICAL FUSION    . LAPAROSCOPIC GASTRIC SLEEVE RESECTION      Past Medical History:  Diagnosis Date  . Asthma   . Seasonal allergies    BP 117/83   Pulse 71   Resp 16   LMP 05/27/2016 (Exact Date)   SpO2 98%   Opioid Risk Score:   Fall Risk Score:  `1  Depression screen PHQ 2/9  Depression screen PHQ 2/9 01/05/2016  Decreased Interest 1  Down, Depressed, Hopeless 0  PHQ - 2 Score 1  Altered sleeping 1  Tired, decreased energy 1  Change in appetite 0  Feeling bad or failure about yourself  0  Trouble concentrating 0  Moving slowly or fidgety/restless 0  Suicidal thoughts 0  PHQ-9 Score 3  Difficult doing work/chores Not difficult at all    Review of Systems     Objective:   Physical Exam  Tenderness over the left trapezius, left levator and left splenis capitis. No tenderness on the right side.  Cervical range of motion 50% with flexion, extension, lateral bending, rotation.      Assessment & Plan:  1. Cervical dystonia, improved after the last injection We'll continue with the current dosing and muscle selection.  left trapezius to 25 units  left levator to 0 units  left splenius Capitis 25 units x 3  Right Semispinalis capitus 12.5 units x 4  sites RIght Splenius Capitus 12.5 units into 2 sites Right Levator scapulae 12.5 units into 2 sites  Repeat in ~ 1 month  2. Patient with sedation/condition side effect using tizanidine, but only when she is sleep deprived. We discussed that she may be more susceptible to the side effect of these medications when she does not get adequate sleep and that she should not take them in these situations.

## 2016-07-26 ENCOUNTER — Ambulatory Visit: Payer: Medicare Other | Admitting: Physical Medicine & Rehabilitation

## 2016-07-29 ENCOUNTER — Encounter: Payer: Self-pay | Admitting: Physical Medicine & Rehabilitation

## 2016-07-29 ENCOUNTER — Encounter: Payer: Medicare Other | Attending: Physical Medicine & Rehabilitation

## 2016-07-29 ENCOUNTER — Ambulatory Visit (HOSPITAL_BASED_OUTPATIENT_CLINIC_OR_DEPARTMENT_OTHER): Payer: Medicare Other | Admitting: Physical Medicine & Rehabilitation

## 2016-07-29 VITALS — BP 109/74 | HR 78 | Resp 14

## 2016-07-29 DIAGNOSIS — G243 Spasmodic torticollis: Secondary | ICD-10-CM | POA: Diagnosis not present

## 2016-07-29 DIAGNOSIS — G249 Dystonia, unspecified: Secondary | ICD-10-CM | POA: Insufficient documentation

## 2016-07-29 DIAGNOSIS — M542 Cervicalgia: Secondary | ICD-10-CM | POA: Diagnosis not present

## 2016-07-29 DIAGNOSIS — M6289 Other specified disorders of muscle: Secondary | ICD-10-CM | POA: Insufficient documentation

## 2016-07-29 NOTE — Patient Instructions (Signed)

## 2016-07-29 NOTE — Progress Notes (Signed)
Diagnosis: Cervical dystonia Procedure: Botulinum toxin injection under needle EMG guidance Indication: Cervical dystonia and pain which has been relieved for greater than 3 months with previous botulinum toxin injections.  Informed consent was obtained after describing risks and benefits of the procedure with the patient. This includes bleeding bruising infection swallowing disorder as well as allergy or intolerance to botulinum toxin. She elects to proceed and has given written consent. RE MS form reviewed and signed  Dilution 50 units per cc   left trapezius 25 units  left levator to 0 units  left splenius Capitis 25 units x 3   Right Semispinalis capitus 12.5 units x 2 sites RIght Splenius Capitus 12.5 units into 1 sites Right Levator scapulae 12.5 units into 1 sites   Patient tolerated procedure well Post procedure instructions given Return to clinic 6 weeks for Reassessment, has had lower dose will see whether this is as effective. Based on EMG activity, would recommend reduction of left trapezius to 12.5 units, left splenius capitis, 25 units times 2, right splenius capitis, 12.5 units times 1 Right semispinalis capitis 12. 5 times 2  If this last injection was not sufficient. We'll need to make additional adjustments to dosings listed above

## 2016-08-26 IMAGING — MR MR LUMBAR SPINE W/O CM
4 of 5 series · 27 of 48 positions shown · non-contrast
Comparison: 01/26/2014

CLINICAL DATA: Low back pain radiating to both hips and buttocks.
Symptoms began 2 years ago.

EXAM:
MRI LUMBAR SPINE WITHOUT CONTRAST
TECHNIQUE: Multiplanar, multisequence MR imaging of the lumbar spine was
performed. No intravenous contrast was administered.

[Series 3: T2 · sagittal · 4.0mm · 0.55mm/px · 6 of 12 slices shown (1 of 2)]
[im 1/12]
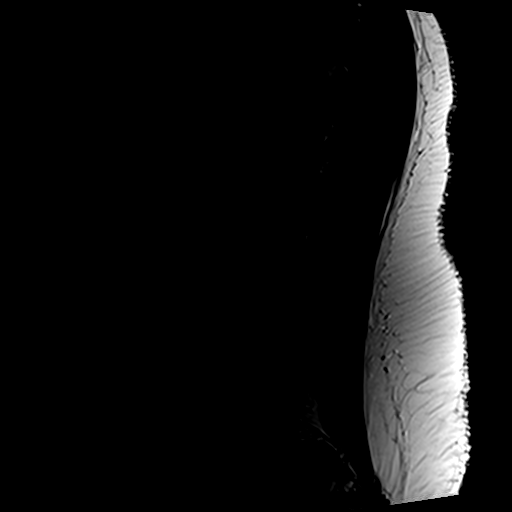
[im 3/12]
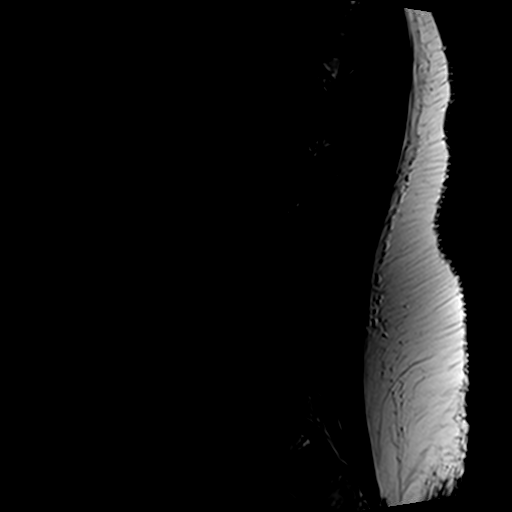
[im 5/12]
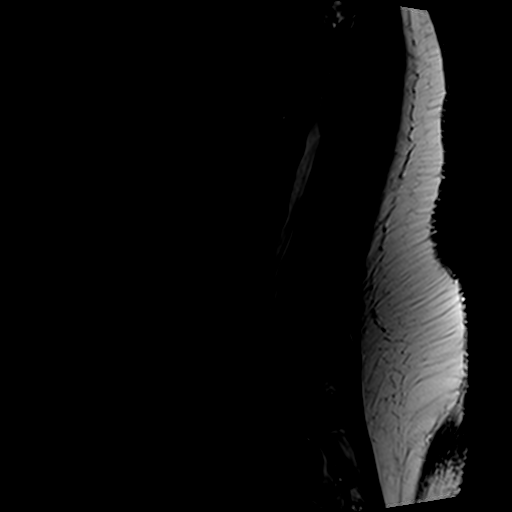
[im 7/12]
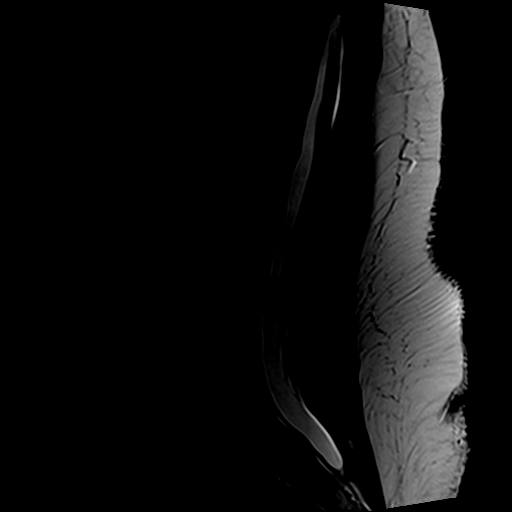
[im 9/12]
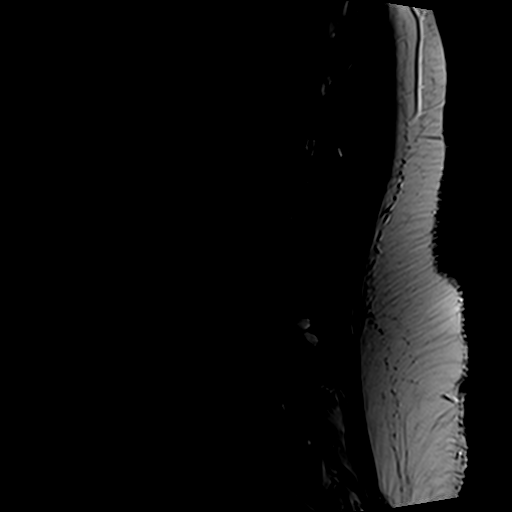
[im 12/12]
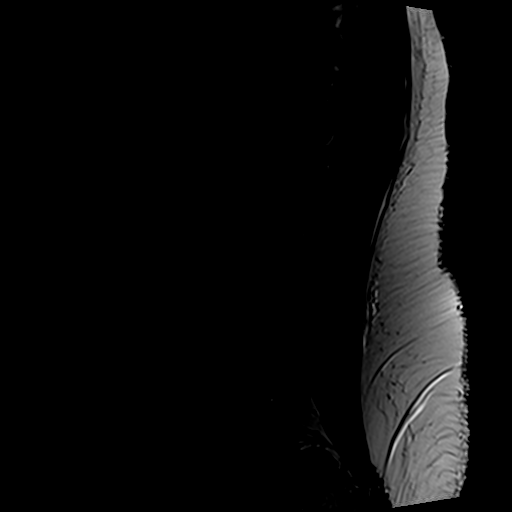

[Series 4: T1 · sagittal · 4.0mm · 0.55mm/px · 5 of 12 slices shown (1 of 2)]
[im 1/12]
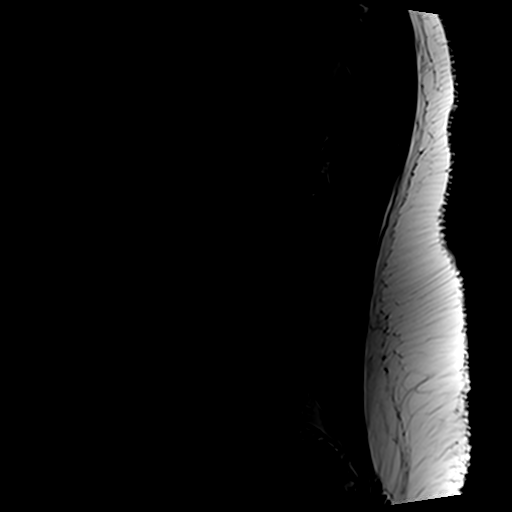
[im 3/12]
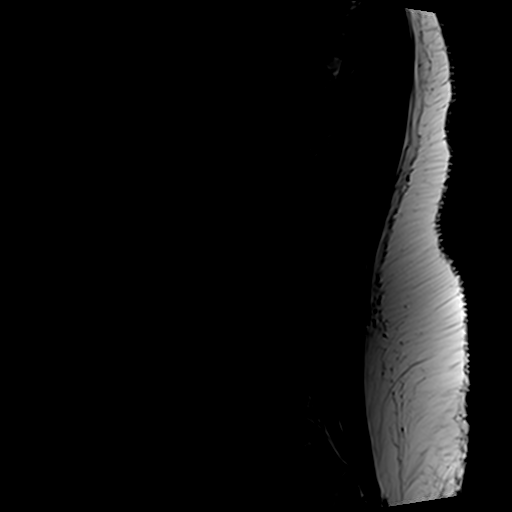
[im 6/12]
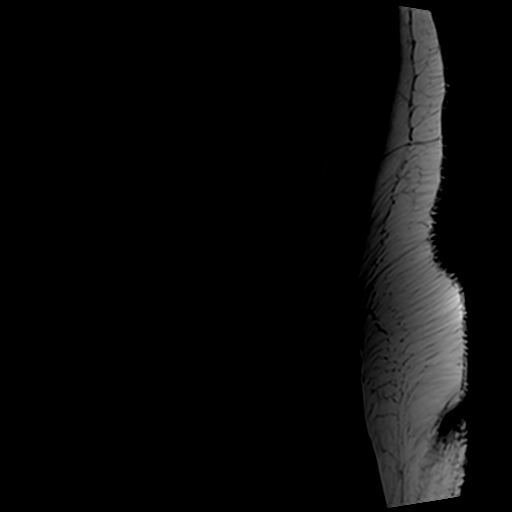
[im 9/12]
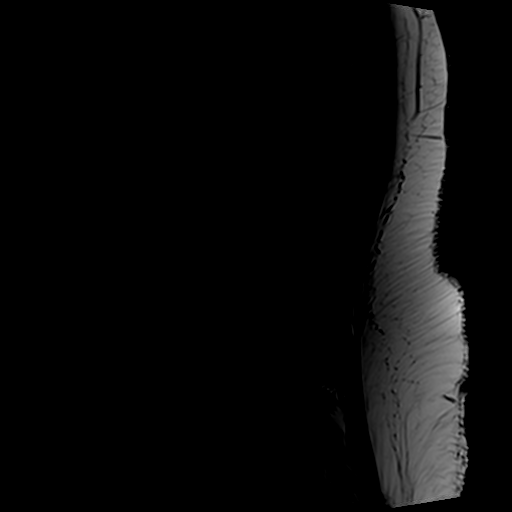
[im 12/12]
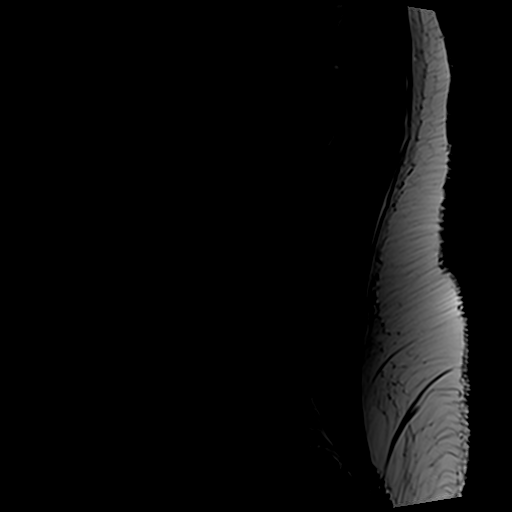

[Series 6: T2 · axial · 4.0mm · 0.70mm/px · z∈[-99,+78]mm · 10 of 36 slices shown (2 of 2)]
[im 3/36]
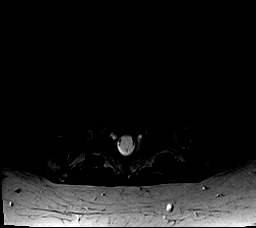
[im 5/36]
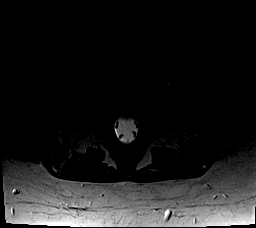
[im 8/36]
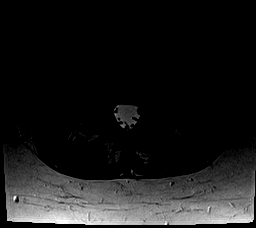
[im 12/36]
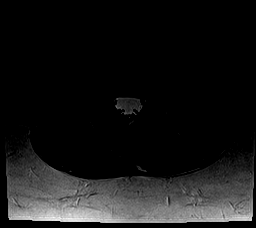
[im 17/36]
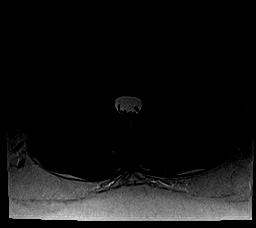
[im 19/36]
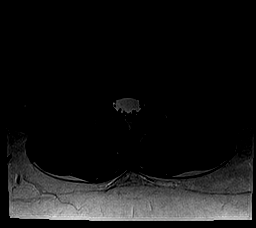
[im 22/36]
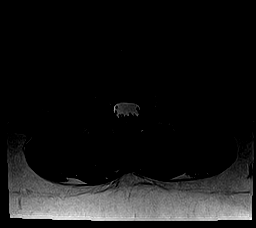
[im 26/36]
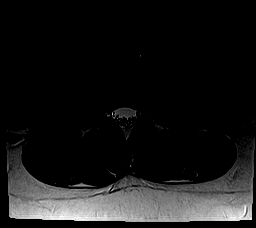
[im 31/36]
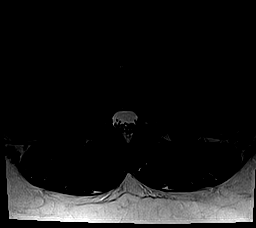
[im 36/36]
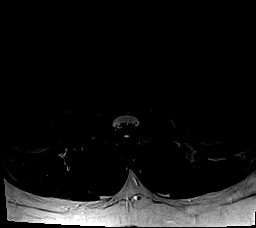

[Series 7: T1 · axial · 4.0mm · 0.35mm/px · z∈[-99,+52]mm · 6 of 36 slices shown (2 of 2)]
[im 3/36]
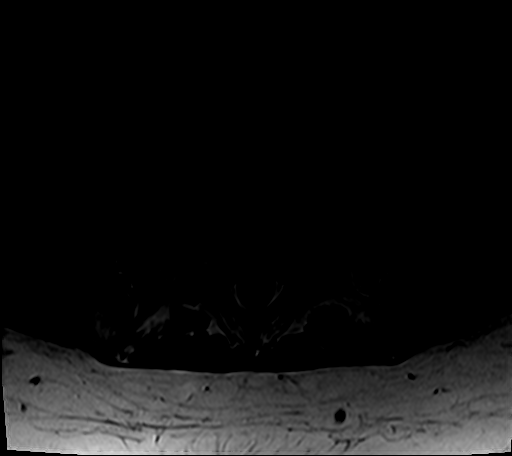
[im 5/36]
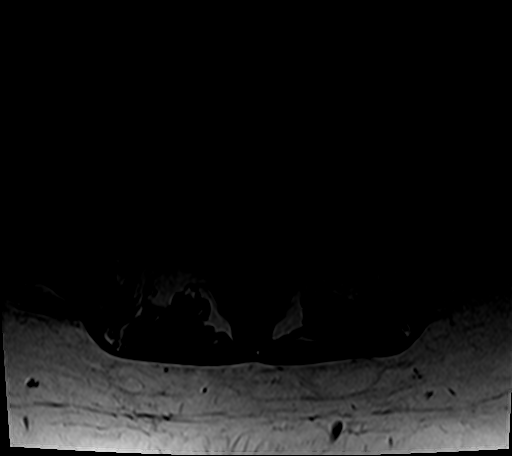
[im 8/36]
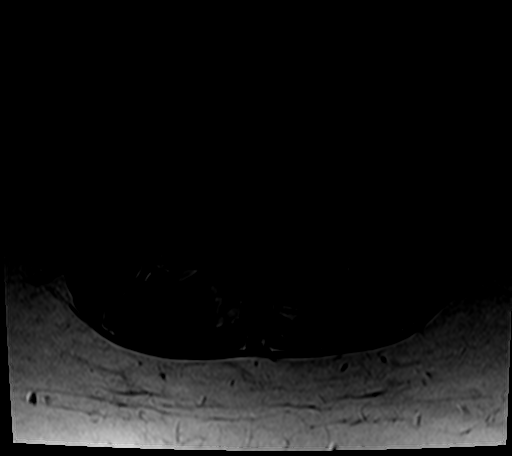
[im 12/36]
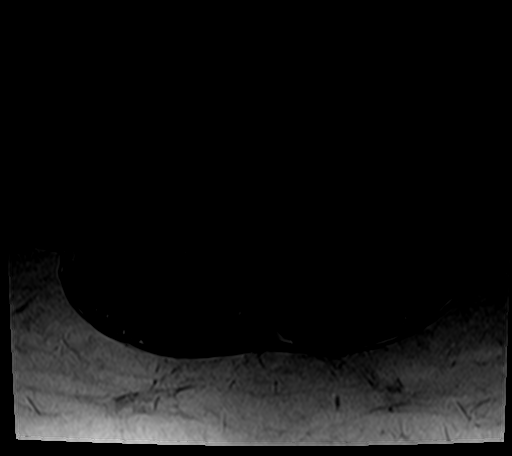
[im 19/36]
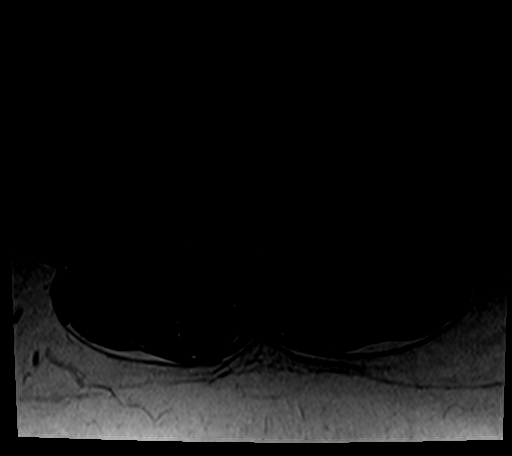
[im 31/36]
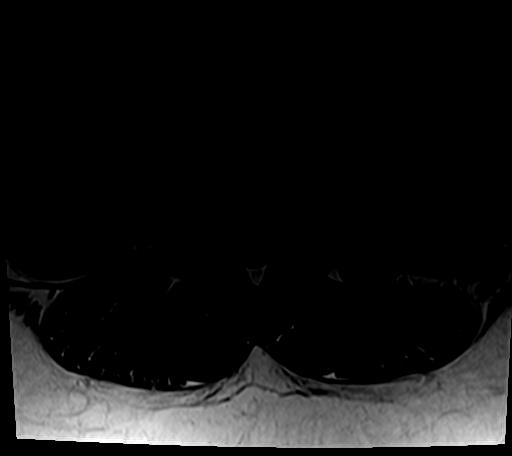

[27 of 48 positions shown; findings below may reference images not displayed]

FINDINGS: There is no disc abnormality in the lumbar region. No degeneration,
bulge or herniation. Patient does have mild facet degeneration
throughout the lumbar region without advanced disease, edematous
change, slippage or encroachment upon the neural spaces. A few of
the facets are associated with small dorsal cysts, most notably on
the left at L1-2, on the left at L3-4 and on the left at L4-5. None
of these have any effect upon the neural structures but are simply
evidence of low grade facet arthropathy. Certainly, this could be
associated with back pain. Facet syndrome pain can radiate to the
hips, buttocks and thighs.
IMPRESSION: No change since last year's exam. No disc pathology, stenosis or
neural compression. Facet arthritis throughout the lumbar region
without advanced disease. No edematous change, slippage or stenosis.
Small cysts associated with some of the facets are a evidence of
facet arthritis but not significant in and of themselves. Certainly,
facet pain can radiate to the hips and buttocks.

## 2016-08-27 ENCOUNTER — Other Ambulatory Visit (INDEPENDENT_AMBULATORY_CARE_PROVIDER_SITE_OTHER): Payer: Self-pay | Admitting: Specialist

## 2016-08-27 NOTE — Telephone Encounter (Signed)
Ok to refill 

## 2016-09-09 ENCOUNTER — Ambulatory Visit (HOSPITAL_BASED_OUTPATIENT_CLINIC_OR_DEPARTMENT_OTHER): Payer: Medicare Other | Admitting: Physical Medicine & Rehabilitation

## 2016-09-09 ENCOUNTER — Encounter: Payer: Self-pay | Admitting: Physical Medicine & Rehabilitation

## 2016-09-09 ENCOUNTER — Encounter: Payer: Medicare Other | Attending: Physical Medicine & Rehabilitation

## 2016-09-09 VITALS — BP 120/81 | HR 86 | Resp 14

## 2016-09-09 DIAGNOSIS — M542 Cervicalgia: Secondary | ICD-10-CM | POA: Diagnosis present

## 2016-09-09 DIAGNOSIS — G243 Spasmodic torticollis: Secondary | ICD-10-CM

## 2016-09-09 DIAGNOSIS — G249 Dystonia, unspecified: Secondary | ICD-10-CM | POA: Diagnosis present

## 2016-09-09 DIAGNOSIS — M6289 Other specified disorders of muscle: Secondary | ICD-10-CM | POA: Insufficient documentation

## 2016-09-09 NOTE — Progress Notes (Signed)
Subjective:    Patient ID: Christine Palmer, female    DOB: 1970-12-22, 45 y.o.   MRN: [id number]  HPI Follow-up from Botox injection. Change doses, reduced About a week after patient noted some stabbing pain, left greater than right side of the neck. left trapezius 25 units  left levator to 0 units  left splenius Capitis 25 units x 3  Right Semispinalis capitus 12.5 units x 2 sites RIght Splenius Capitus 12.5 units into 1 sites Right Levator scapulae 12.5 units into 1 sites  Previous injection did not have any issues like this. The difference was had 2 injections in the right splenius capitis and the right levator scapula  Reviewed initial injections, which occurred on 07/30/2013, 300 units total were injected. Pain Inventory Average Pain 6 Pain Right Now 5 My pain is sharp, dull and aching  In the last 24 hours, has pain interfered with the following? General activity 4 Relation with others 1 Enjoyment of life 3 What TIME of day is your pain at its worst? morning, night Sleep (in general) Fair  Pain is worse with: sitting and standing Pain improves with: rest, medication and injections Relief from Meds: 5  Mobility Do you have any goals in this area?  no  Function disabled: date disabled n/a Do you have any goals in this area?  no  Neuro/Psych spasms depression anxiety  Prior Studies Any changes since last visit?  no  Physicians involved in your care Any changes since last visit?  no   Family History  Problem Relation Age of Onset  . Heart disease Mother   . Hypertension Mother   . COPD Mother   . Alcohol abuse Father   . Stroke Father   . Hypertension Father   . Diabetes Father    Social History   Social History  . Marital status: Single    Spouse name: N/A  . Number of children: N/A  . Years of education: N/A   Social History Main Topics  . Smoking status: Never Smoker  . Smokeless tobacco: Never Used  . Alcohol use Yes  . Drug use: No    . Sexual activity: Not Asked   Other Topics Concern  . None   Social History Narrative  . None   Past Surgical History:  Procedure Laterality Date  . CERVICAL FUSION    . LAPAROSCOPIC GASTRIC SLEEVE RESECTION     Past Medical History:  Diagnosis Date  . Asthma   . Seasonal allergies    BP 120/81   Pulse 86   Resp 14   SpO2 98%   Opioid Risk Score:   Fall Risk Score:  `1  Depression screen PHQ 2/9  Depression screen PHQ 2/9 01/05/2016  Decreased Interest 1  Down, Depressed, Hopeless 0  PHQ - 2 Score 1  Altered sleeping 1  Tired, decreased energy 1  Change in appetite 0  Feeling bad or failure about yourself  0  Trouble concentrating 0  Moving slowly or fidgety/restless 0  Suicidal thoughts 0  PHQ-9 Score 3  Difficult doing work/chores Not difficult at all    Review of Systems  All other systems reviewed and are negative.      Objective:   Physical Exam  Constitutional: She is oriented to person, place, and time. She appears well-developed and well-nourished. No distress.  HENT:  Head: Normocephalic and atraumatic.  Eyes: Conjunctivae and EOM are normal. Pupils are equal, round, and reactive to light.  Neck: No JVD  present.  Reduced cervical range of motion  Lymphadenopathy:    She has no cervical adenopathy.  Neurological: She is alert and oriented to person, place, and time. She exhibits abnormal muscle tone. Coordination normal.  Psychiatric: She has a normal mood and affect. Her behavior is normal. Judgment and thought content normal.  Nursing note and vitals reviewed.   Tenderness to palpation over bilateral trapezius, bilateral splenius capitis, right levator scapula Cervical range of motion 25% flexion, extension, lateral bending and rotation Slight tilt to right Prominent trapezius, prominent right sternocleidomastoid    Assessment & Plan:    1. Cervical dystonia planned for next injection. After 10/28/2016  Botox 200 units total      left  trapezius to 25 units  left levator to 0 units  left splenius Capitis 25 units x 3  Right Semispinalis capitus 12.5 units x 4 sites RIght Splenius Capitus 12.5 units into 2 sites Right Levator scapulae 12.5 units into 2 sites

## 2016-09-23 ENCOUNTER — Other Ambulatory Visit (INDEPENDENT_AMBULATORY_CARE_PROVIDER_SITE_OTHER): Payer: Self-pay | Admitting: Specialist

## 2016-09-23 NOTE — Telephone Encounter (Signed)
Ok to fill 

## 2016-10-26 ENCOUNTER — Other Ambulatory Visit (INDEPENDENT_AMBULATORY_CARE_PROVIDER_SITE_OTHER): Payer: Self-pay | Admitting: Specialist

## 2016-10-31 ENCOUNTER — Telehealth (INDEPENDENT_AMBULATORY_CARE_PROVIDER_SITE_OTHER): Payer: Self-pay | Admitting: Physical Medicine and Rehabilitation

## 2016-11-05 ENCOUNTER — Telehealth (INDEPENDENT_AMBULATORY_CARE_PROVIDER_SITE_OTHER): Payer: Self-pay | Admitting: Physical Medicine and Rehabilitation

## 2016-11-05 ENCOUNTER — Ambulatory Visit: Payer: Medicare Other | Admitting: Physical Medicine & Rehabilitation

## 2016-11-05 NOTE — Telephone Encounter (Signed)
Needs prior auth for [phone], [phone]. Called patient and let her know we would call to scheduled when this is authorized.

## 2016-11-05 NOTE — Telephone Encounter (Signed)
Yes should last longer but ok to repeat at 6 months, we did L3-4 and L4-5 joints prior to this, so yes three level RFA ie L3-4 L4-5 and L5-S1.

## 2016-11-08 NOTE — Telephone Encounter (Signed)
No precert required if pt has only Medicare. Left message # 1 for pt to call back for scheduling.

## 2016-11-11 NOTE — Telephone Encounter (Signed)
Scheduled for 12/04/16 at 330 with driver.

## 2016-12-04 ENCOUNTER — Encounter (INDEPENDENT_AMBULATORY_CARE_PROVIDER_SITE_OTHER): Payer: Self-pay | Admitting: Physical Medicine and Rehabilitation

## 2016-12-04 ENCOUNTER — Ambulatory Visit (INDEPENDENT_AMBULATORY_CARE_PROVIDER_SITE_OTHER): Payer: Medicare Other | Admitting: Physical Medicine and Rehabilitation

## 2016-12-04 ENCOUNTER — Ambulatory Visit (INDEPENDENT_AMBULATORY_CARE_PROVIDER_SITE_OTHER): Payer: Self-pay

## 2016-12-04 VITALS — BP 134/84 | HR 77 | Temp 98.5°F

## 2016-12-04 DIAGNOSIS — M47816 Spondylosis without myelopathy or radiculopathy, lumbar region: Secondary | ICD-10-CM

## 2016-12-04 MED ORDER — METHYLPREDNISOLONE ACETATE 80 MG/ML IJ SUSP
80.0000 mg | Freq: Once | INTRAMUSCULAR | Status: AC
  Administered 2016-12-04: 80 mg

## 2016-12-04 MED ORDER — LIDOCAINE HCL (PF) 1 % IJ SOLN
0.3300 mL | Freq: Once | INTRAMUSCULAR | Status: AC
  Administered 2016-12-04: 0.3 mL

## 2016-12-04 NOTE — Patient Instructions (Signed)

## 2016-12-04 NOTE — Progress Notes (Signed)
Christine Palmer - 46 y.o. female MRN [id number]  Date of birth: 23-Dec-1970  Office Visit Note: Visit Date: 12/04/2016 PCP: Leamon Arnt, MD Referred by: Leamon Arnt, MD  Subjective: Chief Complaint  Patient presents with  . Lower Back - Pain   HPI: Christine Palmer is a 46 year old female that I have seen on a few occasions initially through Dr. Louanne Skye our spine surgeon. Increased left side lower back pain for around 1 month. Occasional right side pain. Denies pain down legs. Pain worse with standing long periods and stiffness worse in the morning. She has had a long history of chronic pain syndrome and chronic low back pain. She has had x-ray and MRI evidence of facet arthropathy without stenosis. She has no radicular complaints. In the past we've completed radiofrequency ablation of the L3-for an L4-5 facet joints with really good relief for almost a year. Subsequently she had more lower back pain at the lumbosacral junction and went through double diagnostic blocks of the L5-S1 facet joint with good relief and we ended up doing a radiofrequency ablation of that joint. This is been approximately 7 months ago and she now returns with left-sided axial low back pain once again. We are going to repeat the radiofrequency ablation of the L3-for L4-5 and L5-S1 facet joints and hopefully good relief for her for quite some time. She'll continue to follow up with Dr. Louanne Skye if needed although she hasn't seen him recently.    ROS Otherwise per HPI.  Assessment & Plan: Visit Diagnoses:  1. Spondylosis without myelopathy or radiculopathy, lumbar region     Plan: Findings:  Chronic pain syndrome and chronic axial low back pain from facet arthropathy and spondylosis. Prior diagnostic blocks and failure of conservative care and good relief with radiofrequency ablation of the L3-for L4-5 and L5-S1 facet joints. We are repeat this today. The last time it was performed was approximately 1 year ago for the  upper 2 facets and 6 months ago for the lower facet joint.    Meds & Orders:  Meds ordered this encounter  Medications  . lidocaine (PF) (XYLOCAINE) 1 % injection 0.3 mL  . methylPREDNISolone acetate (DEPO-MEDROL) injection 80 mg    Orders Placed This Encounter  Procedures  . Radiofrequency,Lumbar  . XR C-ARM NO REPORT    Follow-up: Return if symptoms worsen or fail to improve afte 4weeks.   Procedures: No procedures performed  Lumbar Facet Joint Nerve Denervation  Patient: Christine Palmer      Date of Birth: [birth year] MRN: [id number] PCP: Leamon Arnt, MD      Visit Date: 12/04/2016   Universal Protocol:    Date/Time: 02/01/186:04 AM  Consent Given By: the patient  Position: PRONE  Additional Comments: Vital signs were monitored before and after the procedure. Patient was prepped and draped in the usual sterile fashion. The correct patient, procedure, and site was verified.   Injection Procedure Details:  Procedure Site One Meds Administered:  Meds ordered this encounter  Medications  . lidocaine (PF) (XYLOCAINE) 1 % injection 0.3 mL  . methylPREDNISolone acetate (DEPO-MEDROL) injection 80 mg     Laterality: Left  Location/Site:  L3-L4 L4-L5 L5-S1  Needle size: 18 G  Needle type: Radiofrequency cannula  Needle Placement: Along juncture of superior articular process and transverse pocess  Findings:  -Comments:  Procedure Details: For each desired target nerve, the corresponding transverse process (sacral ala for the L5 dorsal rami) was identified and the fluoroscope was positioned to  square off the endplates of the corresponding vertebral body to achieve a true AP midline view.  The beam was then obliqued 15 to 20 degrees and caudally tilted 15 to 20 degrees to line up a trajectory along the target nerves. The skin over the target of the junction of superior articulating process and transverse process (sacral ala for the L5 dorsal rami) was  infiltrated with 31ml of 1% Lidocaine without Epinephrine.  The 18 gauge 51mm active tip outer cannula was advanced in trajectory view to the target.  This procedure was repeated for each target nerve.  Then, for all levels, the outer cannula placement was fine-tuned and the position was then confirmed with bi-planar imaging.    Test stimulation was done both at sensory and motor levels to ensure there was no radicular stimulation. The target tissues were then infiltrated with 1 ml of 1% Lidocaine without Epinephrine. Subsequently, a percutaneous neurotomy was carried out for 60 seconds at 80 degrees Celsius. The procedure was repeated with the cannula rotated 90 degrees, for duration of 60 seconds, one additional time at each level for a total of two lesions per level.  After the completion of the two lesions, 1 ml of injectate was delivered. It was then repeated for each facet joint nerve mentioned above. Appropriate radiographs were obtained to verify the probe placement during the neurotomy.   Additional Comments:  The patient tolerated the procedure well Dressing: Band-Aid    Post-procedure details: Patient was observed during the procedure. Post-procedure instructions were reviewed.  Patient left the clinic in stable condition.      Clinical History: No specialty comments available.  She reports that she has never smoked. She has never used smokeless tobacco. No results for input(s): HGBA1C, LABURIC in the last 8760 hours.  Objective:  VS:  HT:    WT:   BMI:     BP:134/84  HR:77bpm  TEMP:98.5 F (36.9 C)(Oral)  RESP:100 % Physical Exam  Musculoskeletal:  Patient ambulates without aid with good distal strength with concordant left-sided low back pain with extension rotation.    Ortho Exam Imaging: Xr C-arm No Report  Result Date: 12/04/2016 Please see Notes or Procedures tab for imaging impression.   Past Medical/Family/Surgical/Social History: Medications & Allergies  reviewed per EMR Patient Active Problem List   Diagnosis Date Noted  . Preventative health care 06/21/2013  . 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 Postlaminectomy syndrome, cervical region 06/21/2013   Past Medical History:  Diagnosis Date  . Asthma   . Seasonal allergies    Family History  Problem Relation Age of Onset  . Heart disease Mother   . Hypertension Mother   . COPD Mother   . Alcohol abuse Father   . Stroke Father   . Hypertension Father   . Diabetes Father    Past Surgical History:  Procedure Laterality Date  . CERVICAL FUSION    . LAPAROSCOPIC GASTRIC SLEEVE RESECTION     Social History   Occupational History  . Not on file.   Social History Main Topics  . Smoking status: Never Smoker  . Smokeless tobacco: Never Used  . Alcohol use Yes  . Drug use: No  . Sexual activity: Not on file

## 2016-12-05 ENCOUNTER — Encounter (INDEPENDENT_AMBULATORY_CARE_PROVIDER_SITE_OTHER): Payer: Self-pay | Admitting: Physical Medicine and Rehabilitation

## 2016-12-05 NOTE — Procedures (Signed)
Lumbar Facet Joint Nerve Denervation  Patient: Christine Palmer      Date of Birth: [birth year] MRN: [id number] PCP: Leamon Arnt, MD      Visit Date: 12/04/2016   Universal Protocol:    Date/Time: 02/01/186:04 AM  Consent Given By: the patient  Position: PRONE  Additional Comments: Vital signs were monitored before and after the procedure. Patient was prepped and draped in the usual sterile fashion. The correct patient, procedure, and site was verified.   Injection Procedure Details:  Procedure Site One Meds Administered:  Meds ordered this encounter  Medications  . lidocaine (PF) (XYLOCAINE) 1 % injection 0.3 mL  . methylPREDNISolone acetate (DEPO-MEDROL) injection 80 mg     Laterality: Left  Location/Site:  L3-L4 L4-L5 L5-S1  Needle size: 18 G  Needle type: Radiofrequency cannula  Needle Placement: Along juncture of superior articular process and transverse pocess  Findings:  -Comments:  Procedure Details: For each desired target nerve, the corresponding transverse process (sacral ala for the L5 dorsal rami) was identified and the fluoroscope was positioned to square off the endplates of the corresponding vertebral body to achieve a true AP midline view.  The beam was then obliqued 15 to 20 degrees and caudally tilted 15 to 20 degrees to line up a trajectory along the target nerves. The skin over the target of the junction of superior articulating process and transverse process (sacral ala for the L5 dorsal rami) was infiltrated with 60ml of 1% Lidocaine without Epinephrine.  The 18 gauge 32mm active tip outer cannula was advanced in trajectory view to the target.  This procedure was repeated for each target nerve.  Then, for all levels, the outer cannula placement was fine-tuned and the position was then confirmed with bi-planar imaging.    Test stimulation was done both at sensory and motor levels to ensure there was no radicular stimulation. The target tissues  were then infiltrated with 1 ml of 1% Lidocaine without Epinephrine. Subsequently, a percutaneous neurotomy was carried out for 60 seconds at 80 degrees Celsius. The procedure was repeated with the cannula rotated 90 degrees, for duration of 60 seconds, one additional time at each level for a total of two lesions per level.  After the completion of the two lesions, 1 ml of injectate was delivered. It was then repeated for each facet joint nerve mentioned above. Appropriate radiographs were obtained to verify the probe placement during the neurotomy.   Additional Comments:  The patient tolerated the procedure well Dressing: Band-Aid    Post-procedure details: Patient was observed during the procedure. Post-procedure instructions were reviewed.  Patient left the clinic in stable condition.

## 2016-12-24 ENCOUNTER — Encounter: Payer: Medicare Other | Attending: Physical Medicine & Rehabilitation

## 2016-12-24 ENCOUNTER — Encounter: Payer: Self-pay | Admitting: Physical Medicine & Rehabilitation

## 2016-12-24 ENCOUNTER — Ambulatory Visit (HOSPITAL_BASED_OUTPATIENT_CLINIC_OR_DEPARTMENT_OTHER): Payer: Medicare Other | Admitting: Physical Medicine & Rehabilitation

## 2016-12-24 VITALS — BP 131/84 | HR 88 | Resp 14

## 2016-12-24 DIAGNOSIS — M6289 Other specified disorders of muscle: Secondary | ICD-10-CM | POA: Diagnosis present

## 2016-12-24 DIAGNOSIS — G249 Dystonia, unspecified: Secondary | ICD-10-CM | POA: Insufficient documentation

## 2016-12-24 DIAGNOSIS — M542 Cervicalgia: Secondary | ICD-10-CM | POA: Diagnosis not present

## 2016-12-24 DIAGNOSIS — G243 Spasmodic torticollis: Secondary | ICD-10-CM | POA: Diagnosis not present

## 2016-12-24 NOTE — Patient Instructions (Signed)

## 2016-12-24 NOTE — Progress Notes (Signed)
Diagnosis: Cervical dystonia Procedure: Botulinum toxin injection under needle EMG guidance Indication: Cervical dystonia and pain which has been relieved for greater than 3 months with previous botulinum toxin injections.  Informed consent was obtained after describing risks and benefits of the procedure with the patient. This includes bleeding bruising infection swallowing disorder as well as allergy or intolerance to botulinum toxin. She elects to proceed and has given written consent. RE MS form reviewed and signed  Dilution 50 units per cc   left trapezius 25 units  left levator to 0 units  left splenius Capitis 25 units x 3   Right Semispinalis capitus 12.5 units x 2 sites RIght Splenius Capitus 12.5 units into 1 sites Right Levator scapulae 12.5 units into 1 sites   Patient tolerated procedure well Post procedure instructions given

## 2017-03-24 ENCOUNTER — Ambulatory Visit (HOSPITAL_BASED_OUTPATIENT_CLINIC_OR_DEPARTMENT_OTHER): Payer: Medicare Other | Admitting: Physical Medicine & Rehabilitation

## 2017-03-24 ENCOUNTER — Encounter: Payer: Medicare Other | Attending: Physical Medicine & Rehabilitation

## 2017-03-24 ENCOUNTER — Encounter: Payer: Self-pay | Admitting: Physical Medicine & Rehabilitation

## 2017-03-24 VITALS — BP 130/90 | HR 73

## 2017-03-24 DIAGNOSIS — G249 Dystonia, unspecified: Secondary | ICD-10-CM | POA: Insufficient documentation

## 2017-03-24 DIAGNOSIS — M6289 Other specified disorders of muscle: Secondary | ICD-10-CM | POA: Diagnosis present

## 2017-03-24 DIAGNOSIS — G243 Spasmodic torticollis: Secondary | ICD-10-CM

## 2017-03-24 DIAGNOSIS — M542 Cervicalgia: Secondary | ICD-10-CM | POA: Diagnosis not present

## 2017-03-24 NOTE — Patient Instructions (Signed)

## 2017-03-24 NOTE — Progress Notes (Signed)
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 Chronic cervical pain not relieved by other treatments.  Informed consent was obtained after describing risks and benefits of the procedure with the patient this included bleeding bruising and infection The patient elects to proceed and has given Written consent.  REMS form completed  Patient placed in a seated position A 27-gauge 1 inch needle electrode was used to guide the injection under EMG guidance.  Muscles and dosing: left trapezius to 25 units  left levator to 0 units  left splenius Capitis 25 units x 3  Right Semispinalis capitus 12.5 units x 4 sites RIght Splenius Capitus 12.5 units into 2 sites Right Levator scapulae 12.5 units into 2 sites  All injections done after negative drawback for blood. Patient tolerated procedure well. Post procedure instructions given. Follow up appointment made   

## 2017-06-24 ENCOUNTER — Encounter: Payer: Self-pay | Admitting: Physical Medicine & Rehabilitation

## 2017-06-24 ENCOUNTER — Ambulatory Visit (HOSPITAL_BASED_OUTPATIENT_CLINIC_OR_DEPARTMENT_OTHER): Payer: Medicare Other | Admitting: Physical Medicine & Rehabilitation

## 2017-06-24 ENCOUNTER — Encounter: Payer: Medicare Other | Attending: Physical Medicine & Rehabilitation

## 2017-06-24 VITALS — BP 126/90 | HR 88

## 2017-06-24 DIAGNOSIS — M542 Cervicalgia: Secondary | ICD-10-CM | POA: Insufficient documentation

## 2017-06-24 DIAGNOSIS — G249 Dystonia, unspecified: Secondary | ICD-10-CM | POA: Insufficient documentation

## 2017-06-24 DIAGNOSIS — M6289 Other specified disorders of muscle: Secondary | ICD-10-CM | POA: Diagnosis present

## 2017-06-24 DIAGNOSIS — G243 Spasmodic torticollis: Secondary | ICD-10-CM

## 2017-06-24 NOTE — Progress Notes (Signed)
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 Chronic cervical pain not relieved by other treatments.  Informed consent was obtained after describing risks and benefits of the procedure with the patient this included bleeding bruising and infection The patient elects to proceed and has given Written consent.  REMS form completed  Patient placed in a seated position A 27-gauge 1 inch needle electrode was used to guide the injection under EMG guidance.  Muscles and dosing: left trapezius to 25 units  left levator to 0 units  left splenius Capitis 25 units x 3  Right Semispinalis capitus 12.5 units x 4 sites RIght Splenius Capitus 12.5 units into 2 sites Right Levator scapulae 12.5 units into 2 sites  All injections done after negative drawback for blood. Patient tolerated procedure well. Post procedure instructions given. Follow up appointment made   

## 2017-09-23 ENCOUNTER — Ambulatory Visit: Payer: Medicare Other | Admitting: Physical Medicine & Rehabilitation

## 2017-10-07 ENCOUNTER — Ambulatory Visit (HOSPITAL_BASED_OUTPATIENT_CLINIC_OR_DEPARTMENT_OTHER): Payer: Medicare Other | Admitting: Physical Medicine & Rehabilitation

## 2017-10-07 ENCOUNTER — Encounter: Payer: Medicare Other | Attending: Physical Medicine & Rehabilitation

## 2017-10-07 ENCOUNTER — Encounter: Payer: Self-pay | Admitting: Physical Medicine & Rehabilitation

## 2017-10-07 VITALS — BP 133/84 | HR 81

## 2017-10-07 DIAGNOSIS — G243 Spasmodic torticollis: Secondary | ICD-10-CM | POA: Diagnosis not present

## 2017-10-07 DIAGNOSIS — E669 Obesity, unspecified: Secondary | ICD-10-CM | POA: Insufficient documentation

## 2017-10-07 DIAGNOSIS — Z6831 Body mass index (BMI) 31.0-31.9, adult: Secondary | ICD-10-CM | POA: Diagnosis not present

## 2017-10-07 DIAGNOSIS — E559 Vitamin D deficiency, unspecified: Secondary | ICD-10-CM | POA: Diagnosis not present

## 2017-10-07 DIAGNOSIS — Z9884 Bariatric surgery status: Secondary | ICD-10-CM | POA: Insufficient documentation

## 2017-10-07 DIAGNOSIS — E611 Iron deficiency: Secondary | ICD-10-CM | POA: Insufficient documentation

## 2017-10-07 NOTE — Progress Notes (Signed)
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 Chronic cervical pain not relieved by other treatments.  Informed consent was obtained after describing risks and benefits of the procedure with the patient this included bleeding bruising and infection The patient elects to proceed and has given Written consent.  REMS form completed  Patient placed in a seated position A 27-gauge 1 inch needle electrode was used to guide the injection under EMG guidance.  Muscles and dosing: left trapezius to 25 units  left levator to 0 units  left splenius Capitis 25 units x 3  Right Semispinalis capitus 12.5 units x 4 sites RIght Splenius Capitus 12.5 units into 2 sites Right Levator scapulae 12.5 units into 2 sites  All injections done after negative drawback for blood. Patient tolerated procedure well. Post procedure instructions given. Follow up appointment made   

## 2017-10-07 NOTE — Patient Instructions (Signed)

## 2017-12-16 ENCOUNTER — Telehealth (INDEPENDENT_AMBULATORY_CARE_PROVIDER_SITE_OTHER): Payer: Self-pay | Admitting: Physical Medicine and Rehabilitation

## 2017-12-17 NOTE — Telephone Encounter (Signed)
It would be ok from an insurance standpoint (6 months of relief) but need to make sure it was helpful and not a different pain now.

## 2017-12-18 NOTE — Telephone Encounter (Signed)
Patient said she is hurting in the same area. She got really good relief from the last one for almost one year.

## 2017-12-18 NOTE — Telephone Encounter (Signed)
Left message for patient to call back to discuss.

## 2017-12-18 NOTE — Telephone Encounter (Signed)
Patient needs auth for repeat left 3 level RFA.

## 2017-12-18 NOTE — Telephone Encounter (Signed)
ok 

## 2018-01-05 ENCOUNTER — Encounter: Payer: Self-pay | Admitting: Physical Medicine & Rehabilitation

## 2018-01-05 ENCOUNTER — Encounter: Payer: Medicare Other | Attending: Physical Medicine & Rehabilitation

## 2018-01-05 ENCOUNTER — Ambulatory Visit (HOSPITAL_BASED_OUTPATIENT_CLINIC_OR_DEPARTMENT_OTHER): Payer: Medicare Other | Admitting: Physical Medicine & Rehabilitation

## 2018-01-05 VITALS — BP 114/82 | HR 86

## 2018-01-05 DIAGNOSIS — Z9884 Bariatric surgery status: Secondary | ICD-10-CM | POA: Diagnosis not present

## 2018-01-05 DIAGNOSIS — E611 Iron deficiency: Secondary | ICD-10-CM | POA: Diagnosis not present

## 2018-01-05 DIAGNOSIS — G243 Spasmodic torticollis: Secondary | ICD-10-CM | POA: Diagnosis not present

## 2018-01-05 DIAGNOSIS — E559 Vitamin D deficiency, unspecified: Secondary | ICD-10-CM | POA: Insufficient documentation

## 2018-01-05 DIAGNOSIS — E669 Obesity, unspecified: Secondary | ICD-10-CM | POA: Insufficient documentation

## 2018-01-05 DIAGNOSIS — Z6831 Body mass index (BMI) 31.0-31.9, adult: Secondary | ICD-10-CM | POA: Diagnosis not present

## 2018-01-05 NOTE — Progress Notes (Signed)
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 Chronic cervical pain not relieved by other treatments.  Informed consent was obtained after describing risks and benefits of the procedure with the patient this included bleeding bruising and infection The patient elects to proceed and has given Written consent.  REMS form completed  Patient placed in a seated position A 27-gauge 1 inch needle electrode was used to guide the injection under EMG guidance.  Muscles and dosing: left trapezius to 25 units  left levator to 0 units  left splenius Capitis 25 units x 3  Right Semispinalis capitus 12.5 units x 4 sites RIght Splenius Capitus 12.5 units into 2 sites Right Levator scapulae 12.5 units into 2 sites  All injections done after negative drawback for blood. Patient tolerated procedure well. Post procedure instructions given. Follow up appointment made   

## 2018-01-05 NOTE — Patient Instructions (Signed)

## 2018-01-26 ENCOUNTER — Ambulatory Visit (INDEPENDENT_AMBULATORY_CARE_PROVIDER_SITE_OTHER): Payer: Self-pay

## 2018-01-26 ENCOUNTER — Encounter (INDEPENDENT_AMBULATORY_CARE_PROVIDER_SITE_OTHER): Payer: Self-pay | Admitting: Physical Medicine and Rehabilitation

## 2018-01-26 ENCOUNTER — Ambulatory Visit (INDEPENDENT_AMBULATORY_CARE_PROVIDER_SITE_OTHER): Payer: Medicare Other | Admitting: Physical Medicine and Rehabilitation

## 2018-01-26 VITALS — BP 126/86

## 2018-01-26 DIAGNOSIS — M47816 Spondylosis without myelopathy or radiculopathy, lumbar region: Secondary | ICD-10-CM

## 2018-01-26 MED ORDER — METHYLPREDNISOLONE ACETATE 80 MG/ML IJ SUSP
80.0000 mg | Freq: Once | INTRAMUSCULAR | Status: AC
  Administered 2018-01-26: 80 mg

## 2018-01-26 NOTE — Patient Instructions (Signed)

## 2018-01-26 NOTE — Progress Notes (Signed)
 .  Numeric Pain Rating Scale and Functional Assessment Average Pain 6   In the last MONTH (on 0-10 scale) has pain interfered with the following?  1. General activity like being  able to carry out your everyday physical activities such as walking, climbing stairs, carrying groceries, or moving a chair?  Rating(6)   +Driver, -BT, -Dye Allergies.  

## 2018-01-27 NOTE — Procedures (Signed)
Lumbar Facet Joint Nerve Denervation  Patient: Christine Palmer      Date of Birth: [birth year] MRN: [id number] PCP: Patient, No Pcp Per      Visit Date: 01/26/2018   Universal Protocol:    Date/Time: 03/26/196:05 AM  Consent Given By: the patient  Position: PRONE  Additional Comments: Vital signs were monitored before and after the procedure. Patient was prepped and draped in the usual sterile fashion. The correct patient, procedure, and site was verified.   Injection Procedure Details:  Procedure Site One Meds Administered:  Meds ordered this encounter  Medications  . methylPREDNISolone acetate (DEPO-MEDROL) injection 80 mg     Laterality: Left  Location/Site:  L3-L4 L4-L5 L5-S1  Needle size: 18 G  Needle type: Radiofrequency cannula  Needle Placement: Along juncture of superior articular process and transverse pocess  Findings:  -Comments:  Procedure Details: For each desired target nerve, the corresponding transverse process (sacral ala for the L5 dorsal rami) was identified and the fluoroscope was positioned to square off the endplates of the corresponding vertebral body to achieve a true AP midline view.  The beam was then obliqued 15 to 20 degrees and caudally tilted 15 to 20 degrees to line up a trajectory along the target nerves. The skin over the target of the junction of superior articulating process and transverse process (sacral ala for the L5 dorsal rami) was infiltrated with 18ml of 1% Lidocaine without Epinephrine.  The 18 gauge 63mm active tip outer cannula was advanced in trajectory view to the target.  This procedure was repeated for each target nerve.  Then, for all levels, the outer cannula placement was fine-tuned and the position was then confirmed with bi-planar imaging.    Test stimulation was done both at sensory and motor levels to ensure there was no radicular stimulation. The target tissues were then infiltrated with 1 ml of 1% Lidocaine  without Epinephrine. Subsequently, a percutaneous neurotomy was carried out for 60 seconds at 80 degrees Celsius. The procedure was repeated with the cannula rotated 90 degrees, for duration of 60 seconds, one additional time at each level for a total of two lesions per level.  After the completion of the two lesions, 1 ml of injectate was delivered. It was then repeated for each facet joint nerve mentioned above. Appropriate radiographs were obtained to verify the probe placement during the neurotomy.   Additional Comments:  The patient tolerated the procedure well No complications occurred Dressing: Band-Aid    Post-procedure details: Patient was observed during the procedure. Post-procedure instructions were reviewed.  Patient left the clinic in stable condition.

## 2018-01-27 NOTE — Progress Notes (Signed)
Christine Palmer - 47 y.o. female MRN [id number]  Date of birth: 1971-08-29  Office Visit Note: Visit Date: 01/26/2018 PCP: Patient, No Pcp Per Referred by: Leamon Arnt, MD  Subjective: Chief Complaint  Patient presents with  . Lower Back - Pain   HPI: Mrs. 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  We have completed 2 prior radiofrequency ablation procedures each lasting slightly more than a year.  The last time we saw her was in January 2018.  She has been a patient of Dr. Louanne Skye  in our office.  She has had prior cervical surgery.  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  She comes in today with worsening left-sided axial low back pain without any radicular pain.  No changes in terms of trauma or other events.  She reports worsening of same axial low back pain on the left.  We are going to repeat left L3-4, L4-5 and L5-S1 facet joint denervation.  Please see our prior notes for further details.   ROS Otherwise per HPI.  Assessment & Plan: Visit Diagnoses:  1. Spondylosis without myelopathy or radiculopathy, lumbar region     Plan: No additional findings.   Meds & Orders:  Meds ordered this encounter  Medications  . methylPREDNISolone acetate (DEPO-MEDROL) injection 80 mg    Orders Placed This Encounter  Procedures  . Radiofrequency,Lumbar  . XR C-ARM NO REPORT    Follow-up: Return if symptoms worsen or fail to improve, for Dr. Louanne Skye.   Procedures: No procedures performed  Lumbar Facet Joint Nerve Denervation  Patient: Christine Palmer      Date of Birth: 06-23-1971 MRN: [id number] PCP: Patient, No Pcp Per      Visit Date: 01/26/2018   Universal Protocol:    Date/Time: 03/26/196:05 AM  Consent Given By: the patient  Position: PRONE  Additional Comments: Vital signs were monitored before and after the procedure. Patient was prepped and draped in the  usual sterile fashion. The correct patient, procedure, and site was verified.   Injection Procedure Details:  Procedure Site One Meds Administered:  Meds ordered this encounter  Medications  . methylPREDNISolone acetate (DEPO-MEDROL) injection 80 mg     Laterality: Left  Location/Site:  L3-L4 L4-L5 L5-S1  Needle size: 18 G  Needle type: Radiofrequency cannula  Needle Placement: Along juncture of superior articular process and transverse pocess  Findings:  -Comments:  Procedure Details: For each desired target nerve, the corresponding transverse process (sacral ala for the L5 dorsal rami) was identified and the fluoroscope was positioned to square off the endplates of the corresponding vertebral body to achieve a true AP midline view.  The beam was then obliqued 15 to 20 degrees and caudally tilted 15 to 20 degrees to line up a trajectory along the target nerves. The skin over the target of the junction of superior articulating process and transverse process (sacral ala for the L5 dorsal rami) was infiltrated with 80ml of 1% Lidocaine without Epinephrine.  The 18 gauge 24mm active tip outer cannula was advanced in trajectory view to the target.  This procedure was repeated for each target nerve.  Then, for all levels, the outer cannula placement was fine-tuned and the position was then confirmed with bi-planar imaging.    Test stimulation was done both at sensory and motor levels to ensure there was no radicular stimulation. The target tissues were then infiltrated with 1  ml of 1% Lidocaine without Epinephrine. Subsequently, a percutaneous neurotomy was carried out for 60 seconds at 80 degrees Celsius. The procedure was repeated with the cannula rotated 90 degrees, for duration of 60 seconds, one additional time at each level for a total of two lesions per level.  After the completion of the two lesions, 1 ml of injectate was delivered. It was then repeated for each facet joint nerve  mentioned above. Appropriate radiographs were obtained to verify the probe placement during the neurotomy.   Additional Comments:  The patient tolerated the procedure well No complications occurred Dressing: Band-Aid    Post-procedure details: Patient was observed during the procedure. Post-procedure instructions were reviewed.  Patient left the clinic in stable condition.       Clinical History: No specialty comments available.   She reports that she has never smoked. She has never used smokeless tobacco. No results for input(s): HGBA1C, LABURIC in the last 8760 hours.  Objective:  VS:  HT:    WT:   BMI:     BP:126/86  HR: bpm  TEMP: ( )  RESP:  Physical Exam  Musculoskeletal:  Patient ambulates without aid with good distal strength.  She has concordant back pain with extension of the lumbar spine and left rotation more than right.    Ortho Exam Imaging: Xr C-arm No Report  Result Date: 01/26/2018 Please see Notes or Procedures tab for imaging impression.   Past Medical/Family/Surgical/Social History: Medications & Allergies reviewed per EMR, new medications updated. Patient Active Problem List   Diagnosis Date Noted  . Preventative health care 06/21/2013  . 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 Postlaminectomy syndrome, cervical region 06/21/2013   Past Medical History:  Diagnosis Date  . Asthma   . Seasonal allergies    Family History  Problem Relation Age of Onset  . Heart disease Mother   . Hypertension Mother   . COPD Mother   . Alcohol abuse Father   . Stroke Father   . Hypertension Father   . Diabetes Father    Past Surgical History:  Procedure Laterality Date  . CERVICAL FUSION    . LAPAROSCOPIC GASTRIC SLEEVE RESECTION     Social History   Occupational History  . Not on file  Tobacco Use  . Smoking status: Never Smoker  . Smokeless tobacco: Never Used  Substance and Sexual Activity  . Alcohol use: Yes  . Drug use: No  . Sexual  activity: Not on file

## 2018-04-07 ENCOUNTER — Other Ambulatory Visit: Payer: Self-pay

## 2018-04-07 ENCOUNTER — Encounter: Payer: Self-pay | Admitting: Physical Medicine & Rehabilitation

## 2018-04-07 ENCOUNTER — Encounter: Payer: Medicare Other | Attending: Physical Medicine & Rehabilitation

## 2018-04-07 ENCOUNTER — Ambulatory Visit (HOSPITAL_BASED_OUTPATIENT_CLINIC_OR_DEPARTMENT_OTHER): Payer: Medicare Other | Admitting: Physical Medicine & Rehabilitation

## 2018-04-07 VITALS — BP 124/84 | HR 69 | Ht 67.0 in | Wt 178.8 lb

## 2018-04-07 DIAGNOSIS — Z9884 Bariatric surgery status: Secondary | ICD-10-CM | POA: Insufficient documentation

## 2018-04-07 DIAGNOSIS — G243 Spasmodic torticollis: Secondary | ICD-10-CM | POA: Diagnosis not present

## 2018-04-07 DIAGNOSIS — Z6831 Body mass index (BMI) 31.0-31.9, adult: Secondary | ICD-10-CM | POA: Diagnosis not present

## 2018-04-07 DIAGNOSIS — E559 Vitamin D deficiency, unspecified: Secondary | ICD-10-CM | POA: Insufficient documentation

## 2018-04-07 DIAGNOSIS — E669 Obesity, unspecified: Secondary | ICD-10-CM | POA: Insufficient documentation

## 2018-04-07 DIAGNOSIS — E611 Iron deficiency: Secondary | ICD-10-CM | POA: Insufficient documentation

## 2018-04-07 NOTE — Progress Notes (Signed)
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 Chronic cervical pain not relieved by other treatments.  Informed consent was obtained after describing risks and benefits of the procedure with the patient this included bleeding bruising and infection The patient elects to proceed and has given Written consent.  REMS form completed  Patient placed in a seated position A 27-gauge 1 inch needle electrode was used to guide the injection under EMG guidance.  Muscles and dosing: left trapezius to 25 units  left levator to 0 units  left splenius Capitis 25 units x 3  Right Semispinalis capitus 12.5 units x 4 sites RIght Splenius Capitus 12.5 units into 2 sites Right Levator scapulae 12.5 units into 2 sites  All injections done after negative drawback for blood. Patient tolerated procedure well. Post procedure instructions given. Follow up appointment made   

## 2018-04-07 NOTE — Patient Instructions (Signed)

## 2018-05-08 DIAGNOSIS — Z6832 Body mass index (BMI) 32.0-32.9, adult: Secondary | ICD-10-CM | POA: Insufficient documentation

## 2018-07-09 ENCOUNTER — Encounter: Payer: Medicare Other | Attending: Physical Medicine & Rehabilitation

## 2018-07-09 ENCOUNTER — Encounter: Payer: Self-pay | Admitting: Physical Medicine & Rehabilitation

## 2018-07-09 ENCOUNTER — Ambulatory Visit (HOSPITAL_BASED_OUTPATIENT_CLINIC_OR_DEPARTMENT_OTHER): Payer: Medicare Other | Admitting: Physical Medicine & Rehabilitation

## 2018-07-09 VITALS — BP 114/79 | HR 84 | Ht 67.0 in | Wt 169.0 lb

## 2018-07-09 DIAGNOSIS — G243 Spasmodic torticollis: Secondary | ICD-10-CM

## 2018-07-09 DIAGNOSIS — E559 Vitamin D deficiency, unspecified: Secondary | ICD-10-CM | POA: Diagnosis not present

## 2018-07-09 DIAGNOSIS — Z9884 Bariatric surgery status: Secondary | ICD-10-CM | POA: Diagnosis not present

## 2018-07-09 DIAGNOSIS — E669 Obesity, unspecified: Secondary | ICD-10-CM | POA: Diagnosis present

## 2018-07-09 DIAGNOSIS — Z6831 Body mass index (BMI) 31.0-31.9, adult: Secondary | ICD-10-CM | POA: Diagnosis not present

## 2018-07-09 DIAGNOSIS — E611 Iron deficiency: Secondary | ICD-10-CM | POA: Diagnosis not present

## 2018-07-09 DIAGNOSIS — G8929 Other chronic pain: Secondary | ICD-10-CM | POA: Insufficient documentation

## 2018-07-09 NOTE — Patient Instructions (Signed)

## 2018-07-09 NOTE — Progress Notes (Signed)
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 Chronic cervical pain not relieved by other treatments.  Informed consent was obtained after describing risks and benefits of the procedure with the patient this included bleeding bruising and infection The patient elects to proceed and has given Written consent.  REMS form completed  Patient placed in a seated position A 27-gauge 1 inch needle electrode was used to guide the injection under EMG guidance.  Muscles and dosing: left trapezius to 25 units  left levator to 0 units  left splenius Capitis 25 units x 3  Right Semispinalis capitus 12.5 units x 4 sites RIght Splenius Capitus 12.5 units into 2 sites Right Levator scapulae 12.5 units into 2 sites  All injections done after negative drawback for blood. Patient tolerated procedure well. Post procedure instructions given. Follow up appointment made   

## 2018-10-08 ENCOUNTER — Ambulatory Visit: Payer: Medicare Other | Admitting: Physical Medicine & Rehabilitation

## 2018-10-15 ENCOUNTER — Encounter: Payer: Medicare Other | Attending: Physical Medicine & Rehabilitation

## 2018-10-15 ENCOUNTER — Encounter: Payer: Self-pay | Admitting: Physical Medicine & Rehabilitation

## 2018-10-15 ENCOUNTER — Ambulatory Visit (HOSPITAL_BASED_OUTPATIENT_CLINIC_OR_DEPARTMENT_OTHER): Payer: Medicare Other | Admitting: Physical Medicine & Rehabilitation

## 2018-10-15 VITALS — BP 137/93 | HR 71 | Ht 67.0 in | Wt 175.0 lb

## 2018-10-15 DIAGNOSIS — G243 Spasmodic torticollis: Secondary | ICD-10-CM | POA: Diagnosis not present

## 2018-10-15 DIAGNOSIS — G8929 Other chronic pain: Secondary | ICD-10-CM | POA: Diagnosis not present

## 2018-10-15 NOTE — Patient Instructions (Signed)

## 2018-10-15 NOTE — Progress Notes (Signed)
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 Chronic cervical pain not relieved by other treatments.  Informed consent was obtained after describing risks and benefits of the procedure with the patient this included bleeding bruising and infection The patient elects to proceed and has given Written consent.  REMS form completed  Patient placed in a seated position A 27-gauge 1 inch needle electrode was used to guide the injection under EMG guidance.  Muscles and dosing: left trapezius to 25 units  left levator to 0 units  left splenius Capitis 25 units x 3  Right Semispinalis capitus 12.5 units x 4 sites RIght Splenius Capitus 12.5 units into 2 sites Right Levator scapulae 12.5 units into 2 sites  All injections done after negative drawback for blood. Patient tolerated procedure well. Post procedure instructions given. Follow up appointment made

## 2019-01-14 ENCOUNTER — Ambulatory Visit: Payer: Medicare Other | Admitting: Physical Medicine & Rehabilitation

## 2019-01-14 ENCOUNTER — Telehealth (INDEPENDENT_AMBULATORY_CARE_PROVIDER_SITE_OTHER): Payer: Self-pay | Admitting: Physical Medicine and Rehabilitation

## 2019-01-14 NOTE — Telephone Encounter (Signed)
New Message  Pt dropped off Handicap paperwork

## 2019-01-21 ENCOUNTER — Encounter: Payer: Medicare HMO | Attending: Physical Medicine & Rehabilitation

## 2019-01-21 ENCOUNTER — Ambulatory Visit: Payer: Medicare HMO | Admitting: Physical Medicine & Rehabilitation

## 2019-01-21 ENCOUNTER — Other Ambulatory Visit: Payer: Self-pay

## 2019-01-21 ENCOUNTER — Encounter: Payer: Self-pay | Admitting: Physical Medicine & Rehabilitation

## 2019-01-21 VITALS — BP 115/83 | HR 65 | Ht 67.0 in | Wt 179.0 lb

## 2019-01-21 DIAGNOSIS — G243 Spasmodic torticollis: Secondary | ICD-10-CM | POA: Diagnosis present

## 2019-01-21 NOTE — Progress Notes (Signed)
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 Chronic cervical pain not relieved by other treatments.  Informed consent was obtained after describing risks and benefits of the procedure with the patient this included bleeding bruising and infection The patient elects to proceed and has given Written consent.  REMS form completed  Patient placed in a seated position A 27-gauge 1 inch needle electrode was used to guide the injection under EMG guidance.  Muscles and dosing: left trapezius to 25 units  left levator to 0 units  left splenius Capitis 25 units x 3  Right Semispinalis capitus 25units x 2 sites RIght Splenius Capitus 25 units into 1 sites Right Levator scapulae 25 units into 1 sites  All injections done after negative drawback for blood. Patient tolerated procedure well. Post procedure instructions given. Follow up appointment made   

## 2019-01-21 NOTE — Patient Instructions (Signed)

## 2019-04-09 ENCOUNTER — Telehealth: Payer: Self-pay | Admitting: Physical Medicine and Rehabilitation

## 2019-04-09 NOTE — Telephone Encounter (Signed)
ok 

## 2019-04-09 NOTE — Telephone Encounter (Signed)
Is auth needed? Patient not scheduled.

## 2019-04-15 ENCOUNTER — Telehealth: Payer: Self-pay | Admitting: *Deleted

## 2019-04-15 NOTE — Telephone Encounter (Signed)
Spoke with Christine Palmer and she states pa is needed for [phone] and [phone]. Sent clinical notes to [phone].

## 2019-04-15 NOTE — Telephone Encounter (Signed)
Called pt and she states last RFA gave her 85% of relief.

## 2019-04-20 NOTE — Telephone Encounter (Signed)
Received auth# [id number] Effective dates 04/16/2019-10/13/2019

## 2019-04-23 ENCOUNTER — Encounter: Payer: Self-pay | Admitting: Physical Medicine & Rehabilitation

## 2019-04-23 ENCOUNTER — Other Ambulatory Visit: Payer: Self-pay

## 2019-04-23 ENCOUNTER — Encounter: Payer: Medicare HMO | Attending: Physical Medicine & Rehabilitation | Admitting: Physical Medicine & Rehabilitation

## 2019-04-23 DIAGNOSIS — G243 Spasmodic torticollis: Secondary | ICD-10-CM | POA: Insufficient documentation

## 2019-04-23 MED ORDER — TIZANIDINE HCL 4 MG PO TABS: 4.0000 mg | ORAL_TABLET | Freq: Two times a day (BID) | ORAL | 3 refills | Status: DC

## 2019-04-23 NOTE — Patient Instructions (Signed)

## 2019-04-23 NOTE — Progress Notes (Signed)
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 Chronic cervical pain not relieved by other treatments.  Informed consent was obtained after describing risks and benefits of the procedure with the patient this included bleeding bruising and infection The patient elects to proceed and has given Written consent.  REMS form completed  Patient placed in a seated position A 27-gauge 1 inch needle electrode was used to guide the injection under EMG guidance.  Muscles and dosing: left trapezius to 25 units  left levator to 0 units  left splenius Capitis 25 units x 3  Right Semispinalis capitus 25units x 2 sites RIght Splenius Capitus 25 units into 1 sites Right Levator scapulae 25 units into 1 sites  All injections done after negative drawback for blood. Patient tolerated procedure well. Post procedure instructions given. Follow up appointment made

## 2019-05-06 NOTE — Telephone Encounter (Signed)
Scheduled for 7/20 at 1000.

## 2019-05-24 ENCOUNTER — Ambulatory Visit (INDEPENDENT_AMBULATORY_CARE_PROVIDER_SITE_OTHER): Payer: Medicare HMO | Admitting: Physical Medicine and Rehabilitation

## 2019-05-24 ENCOUNTER — Encounter: Payer: Self-pay | Admitting: Physical Medicine and Rehabilitation

## 2019-05-24 ENCOUNTER — Ambulatory Visit: Payer: Self-pay

## 2019-05-24 VITALS — BP 123/83 | HR 68

## 2019-05-24 DIAGNOSIS — M47816 Spondylosis without myelopathy or radiculopathy, lumbar region: Secondary | ICD-10-CM

## 2019-05-24 DIAGNOSIS — M545 Low back pain, unspecified: Secondary | ICD-10-CM

## 2019-05-24 DIAGNOSIS — G8929 Other chronic pain: Secondary | ICD-10-CM

## 2019-05-24 MED ORDER — METHYLPREDNISOLONE ACETATE 80 MG/ML IJ SUSP
80.0000 mg | Freq: Once | INTRAMUSCULAR | Status: AC
  Administered 2019-05-24: 80 mg

## 2019-05-24 NOTE — Progress Notes (Signed)
 .  Numeric Pain Rating Scale and Functional Assessment Average Pain 7   In the last MONTH (on 0-10 scale) has pain interfered with the following?  1. General activity like being  able to carry out your everyday physical activities such as walking, climbing stairs, carrying groceries, or moving a chair?  Rating(6)   +Driver, -BT, -Dye Allergies.  

## 2019-05-24 NOTE — Procedures (Signed)
Lumbar Facet Joint Nerve Denervation  Patient: Christine Palmer      Date of Birth: [birth year] MRN: [id number] PCP: Patient, No Pcp Per      Visit Date: 05/24/2019   Universal Protocol:    Date/Time: 05/24/2011:23 PM  Consent Given By: the patient  Position: PRONE  Additional Comments: Vital signs were monitored before and after the procedure. Patient was prepped and draped in the usual sterile fashion. The correct patient, procedure, and site was verified.   Injection Procedure Details:  Procedure Site One Meds Administered:  Meds ordered this encounter  Medications  . methylPREDNISolone acetate (DEPO-MEDROL) injection 80 mg     Laterality: Left  Location/Site: Left L2 and L3 and L4 medial branches and left L5 dorsal rami L3-L4 L4-L5 L5-S1  Needle size: 18 G  Needle type: Radiofrequency cannula  Needle Placement: Along juncture of superior articular process and transverse pocess  Findings:  -Comments:  Procedure Details: For each desired target nerve, the corresponding transverse process (sacral ala for the L5 dorsal rami) was identified and the fluoroscope was positioned to square off the endplates of the corresponding vertebral body to achieve a true AP midline view.  The beam was then obliqued 15 to 20 degrees and caudally tilted 15 to 20 degrees to line up a trajectory along the target nerves. The skin over the target of the junction of superior articulating process and transverse process (sacral ala for the L5 dorsal rami) was infiltrated with 10ml of 1% Lidocaine without Epinephrine.  The 18 gauge 24mm active tip outer cannula was advanced in trajectory view to the target.  This procedure was repeated for each target nerve.  Then, for all levels, the outer cannula placement was fine-tuned and the position was then confirmed with bi-planar imaging.    Test stimulation was done both at sensory and motor levels to ensure there was no radicular stimulation. The  target tissues were then infiltrated with 1 ml of 1% Lidocaine without Epinephrine. Subsequently, a percutaneous neurotomy was carried out for 90 seconds at 80 degrees Celsius.  After the completion of the lesion, 1 ml of injectate was delivered. It was then repeated for each facet joint nerve mentioned above. Appropriate radiographs were obtained to verify the probe placement during the neurotomy.   Additional Comments:  The patient tolerated the procedure well Dressing: 2 x 2 sterile gauze and Band-Aid    Post-procedure details: Patient was observed during the procedure. Post-procedure instructions were reviewed.  Patient left the clinic in stable condition.

## 2019-05-24 NOTE — Progress Notes (Signed)
Christine Palmer - 48 y.o. female MRN [id number]  Date of birth: 08/03/1971  Office Visit Note: Visit Date: 05/24/2019 PCP: Patient, No Pcp Per Referred by: No ref. provider found  Subjective: Chief Complaint  Patient presents with  . Lower Back - Pain   HPI:  Christine Palmer is a 48 y.o. female who comes in today For planned repeat left L3-4 and L4-5 and left L5-S1 facet joint radiofrequency ablation.  Patient has had this on 2 prior occasions both in March 2018 and March 2019 with good results of her chronic left-sided axial low back pain.  Please see our prior notes this year in March for further details justification.  She has been treated in the past by Dr. Basil Dess from a spine surgery standpoint.  She has had prior ACDF of the cervical spine.  She is also treated by Dr. Alysia Penna for spasmodic torticollis.  She does get Botox injections.  She does take a regimen of Wellbutrin along with Cymbalta and tramadol.  She has had MRI evidence of facet arthropathy really for her age.  She has no stenosis.  No radicular complaints.  She is done well in the past with a prior ablation.  Exam is consistent with facet mediated pain with extension and facet loading.  ROS Otherwise per HPI.  Assessment & Plan: Visit Diagnoses:  1. Spondylosis without myelopathy or radiculopathy, lumbar region   2. Chronic left-sided low back pain without sciatica     Plan: No additional findings.   Meds & Orders:  Meds ordered this encounter  Medications  . methylPREDNISolone acetate (DEPO-MEDROL) injection 80 mg    Orders Placed This Encounter  Procedures  . Radiofrequency,Lumbar  . XR C-ARM NO REPORT    Follow-up: No follow-ups on file.   Procedures: No procedures performed  Lumbar Facet Joint Nerve Denervation  Patient: Christine Palmer      Date of Birth: 1971-02-12 MRN: [id number] PCP: Patient, No Pcp Per      Visit Date: 05/24/2019   Universal Protocol:    Date/Time:  05/24/2011:23 PM  Consent Given By: the patient  Position: PRONE  Additional Comments: Vital signs were monitored before and after the procedure. Patient was prepped and draped in the usual sterile fashion. The correct patient, procedure, and site was verified.   Injection Procedure Details:  Procedure Site One Meds Administered:  Meds ordered this encounter  Medications  . methylPREDNISolone acetate (DEPO-MEDROL) injection 80 mg     Laterality: Left  Location/Site: Left L2 and L3 and L4 medial branches and left L5 dorsal rami L3-L4 L4-L5 L5-S1  Needle size: 18 G  Needle type: Radiofrequency cannula  Needle Placement: Along juncture of superior articular process and transverse pocess  Findings:  -Comments:  Procedure Details: For each desired target nerve, the corresponding transverse process (sacral ala for the L5 dorsal rami) was identified and the fluoroscope was positioned to square off the endplates of the corresponding vertebral body to achieve a true AP midline view.  The beam was then obliqued 15 to 20 degrees and caudally tilted 15 to 20 degrees to line up a trajectory along the target nerves. The skin over the target of the junction of superior articulating process and transverse process (sacral ala for the L5 dorsal rami) was infiltrated with 104ml of 1% Lidocaine without Epinephrine.  The 18 gauge 31mm active tip outer cannula was advanced in trajectory view to the target.  This procedure was repeated for each target nerve.  Then, for all levels, the outer cannula placement was fine-tuned and the position was then confirmed with bi-planar imaging.    Test stimulation was done both at sensory and motor levels to ensure there was no radicular stimulation. The target tissues were then infiltrated with 1 ml of 1% Lidocaine without Epinephrine. Subsequently, a percutaneous neurotomy was carried out for 90 seconds at 80 degrees Celsius.  After the completion of the  lesion, 1 ml of injectate was delivered. It was then repeated for each facet joint nerve mentioned above. Appropriate radiographs were obtained to verify the probe placement during the neurotomy.   Additional Comments:  The patient tolerated the procedure well Dressing: 2 x 2 sterile gauze and Band-Aid    Post-procedure details: Patient was observed during the procedure. Post-procedure instructions were reviewed.  Patient left the clinic in stable condition.      Clinical History: No specialty comments available.     Objective:  VS:  HT:    WT:   BMI:     BP:123/83  HR:68bpm  TEMP: ( )  RESP:  Physical Exam  Ortho Exam Imaging: Xr C-arm No Report  Result Date: 05/24/2019 Please see Notes tab for imaging impression.

## 2019-07-26 ENCOUNTER — Encounter: Payer: Medicare HMO | Attending: Physical Medicine & Rehabilitation | Admitting: Physical Medicine & Rehabilitation

## 2019-07-26 ENCOUNTER — Other Ambulatory Visit: Payer: Self-pay

## 2019-07-26 VITALS — BP 128/84 | HR 81 | Temp 97.5°F | Ht 66.0 in | Wt 185.0 lb

## 2019-07-26 DIAGNOSIS — G243 Spasmodic torticollis: Secondary | ICD-10-CM | POA: Insufficient documentation

## 2019-07-26 NOTE — Progress Notes (Signed)
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 Chronic cervical pain not relieved by other treatments.  Informed consent was obtained after describing risks and benefits of the procedure with the patient this included bleeding bruising and infection The patient elects to proceed and has given Written consent.  REMS form completed  Patient placed in a seated position A 27-gauge 1 inch needle electrode was used to guide the injection under EMG guidance.  Muscles and dosing: left trapezius to 25 units   left splenius Capitis 25 units x 3  Right Semispinalis capitus 25units x 2 sites RIght Splenius Capitus 25 units into 1 sites Right Levator scapulae 25 units into 1 sites  All injections done after negative drawback for blood. Patient tolerated procedure well. Post procedure instructions given. Follow up appointment made

## 2019-07-26 NOTE — Patient Instructions (Addendum)
Botulinum toxin injection for cervical dystonia   Muscles and dosing: left trapezius to 25 units   left splenius Capitis 25 units x 3  Right Semispinalis capitus 25units x 2 sites RIght Splenius Capitus 25 units into 1 sites Right Levator scapulae 25 units into 1 sites  You received a Botox injection today. You may experience soreness at the needle injection sites. Please call us if any of the injection sites turns red after a couple days or if there is any drainage. You may experience muscle weakness as a result of Botox. This would improve with time but can take several weeks to improve. The Botox should start working in about one week. The Botox usually last 3 months. The injection can be repeated every 3 months as needed.

## 2019-09-19 ENCOUNTER — Other Ambulatory Visit: Payer: Self-pay | Admitting: Physical Medicine & Rehabilitation

## 2019-10-21 ENCOUNTER — Encounter: Payer: Self-pay | Admitting: Physical Medicine & Rehabilitation

## 2019-10-21 ENCOUNTER — Encounter: Payer: Medicare HMO | Attending: Physical Medicine & Rehabilitation | Admitting: Physical Medicine & Rehabilitation

## 2019-10-21 ENCOUNTER — Other Ambulatory Visit: Payer: Self-pay

## 2019-10-21 ENCOUNTER — Telehealth: Payer: Self-pay | Admitting: *Deleted

## 2019-10-21 VITALS — BP 121/81 | HR 88 | Temp 97.5°F | Ht 67.0 in | Wt 188.0 lb

## 2019-10-21 DIAGNOSIS — G243 Spasmodic torticollis: Secondary | ICD-10-CM | POA: Diagnosis not present

## 2019-10-21 NOTE — Patient Instructions (Signed)

## 2019-10-21 NOTE — Telephone Encounter (Signed)
LK notified and appt note changed.

## 2019-10-21 NOTE — Progress Notes (Signed)
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 Chronic cervical pain not relieved by other treatments.  Informed consent was obtained after describing risks and benefits of the procedure with the patient this included bleeding bruising and infection The patient elects to proceed and has given Written consent.  REMS form completed  Patient placed in a seated position A 27-gauge 1 inch needle electrode was used to guide the injection under EMG guidance.  Muscles and dosing: left trapezius to 25 units   left splenius Capitis 25 units x 3  Right Semispinalis capitus 25units x 2 sites RIght Splenius Capitus 25 units into 1 sites Right Levator scapulae 25 units into 1 sites  All injections done after negative drawback for blood. Patient tolerated procedure well. Post procedure instructions given. Follow up appointment made

## 2019-10-21 NOTE — Telephone Encounter (Signed)
-----   Message from Charlett Blake, MD sent at 10/21/2019  5:23 AM EST ----- This pt should be a Botox injection cervical dystonia, G24.3  200U ,

## 2020-01-17 ENCOUNTER — Other Ambulatory Visit: Payer: Self-pay | Admitting: Physical Medicine & Rehabilitation

## 2020-01-20 ENCOUNTER — Encounter: Payer: Self-pay | Admitting: Physical Medicine & Rehabilitation

## 2020-01-20 ENCOUNTER — Encounter: Payer: Medicare HMO | Attending: Physical Medicine & Rehabilitation | Admitting: Physical Medicine & Rehabilitation

## 2020-01-20 ENCOUNTER — Other Ambulatory Visit: Payer: Self-pay

## 2020-01-20 VITALS — BP 120/82 | HR 70 | Temp 97.9°F | Ht 67.0 in | Wt 195.0 lb

## 2020-01-20 DIAGNOSIS — G243 Spasmodic torticollis: Secondary | ICD-10-CM

## 2020-01-20 NOTE — Progress Notes (Signed)
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 Chronic cervical pain not relieved by other treatments.  Informed consent was obtained after describing risks and benefits of the procedure with the patient this included bleeding bruising and infection The patient elects to proceed and has given Written consent.  REMS form completed  Patient placed in a seated position A 27-gauge 1 inch needle electrode was used to guide the injection under EMG guidance.  Muscles and dosing: left trapezius to 25 units 0-1+ MUAP   left splenius Capitis 25 units x 3 3+ MUAP  Right Semispinalis capitus 25units x 2 sites 3+ MUAP RIght Splenius Capitus 25 units into 1 sites 3+ MUAP Right Levator scapulae 25 units into 1 sites 0-1 +MUAP  All injections done after negative drawback for blood. Patient tolerated procedure well. Post procedure instructions given. Follow up appointment made  As discussed with patient may consider not injecting the left trapezius or the right levator scapula at next visit also may be able to stretch out visits to 3-1/2 months

## 2020-01-20 NOTE — Patient Instructions (Signed)
Will consider reducing Botox dose by 50 U next time

## 2020-01-27 ENCOUNTER — Ambulatory Visit: Payer: Medicare HMO | Attending: Internal Medicine

## 2020-01-27 DIAGNOSIS — Z23 Encounter for immunization: Secondary | ICD-10-CM

## 2020-01-27 NOTE — Progress Notes (Signed)
   Covid-19 Vaccination Clinic  Name:  Christine Palmer    MRN: [id number] DOB: 1970-11-11  01/27/2020  [name] was observed post Covid-19 immunization for 30 minutes based on pre-vaccination screening without incident. She was provided with Vaccine Information Sheet and instruction to access the V-Safe system.   [name] was instructed to call 911 with any severe reactions post vaccine: Marland Kitchen Difficulty breathing  . Swelling of face and throat  . A fast heartbeat  . A bad rash all over body  . Dizziness and weakness   Immunizations Administered    Name Date Dose VIS Date Route   Moderna COVID-19 Vaccine 01/27/2020 11:38 AM 0.5 mL 10/05/2019 Intramuscular   Manufacturer: Moderna   Lot: [id number]   [id number]

## 2020-02-29 ENCOUNTER — Ambulatory Visit: Payer: Medicare HMO | Attending: Internal Medicine

## 2020-02-29 DIAGNOSIS — Z23 Encounter for immunization: Secondary | ICD-10-CM

## 2020-02-29 NOTE — Progress Notes (Signed)
   Covid-19 Vaccination Clinic  Name:  Christine Palmer    MRN: [id number] DOB: 16-Apr-1971  02/29/2020  [name] was observed post Covid-19 immunization for 15 minutes without incident. She was provided with Vaccine Information Sheet and instruction to access the V-Safe system.   [name] was instructed to call 911 with any severe reactions post vaccine: Marland Kitchen Difficulty breathing  . Swelling of face and throat  . A fast heartbeat  . A bad rash all over body  . Dizziness and weakness   Immunizations Administered    Name Date Dose VIS Date Route   Moderna COVID-19 Vaccine 02/29/2020 10:34 AM 0.5 mL 10/2019 Intramuscular   Manufacturer: Levan Hurst   Lot: [id number]   [place]: [id number]      Covid-19 Vaccination Clinic  Name:  Christine Palmer    MRN: [id number] DOB: 06/04/1971  02/29/2020  [name] was observed post Covid-19 immunization for 15 minutes without incident. She was provided with Vaccine Information Sheet and instruction to access the V-Safe system.   [name] was instructed to call 911 with any severe reactions post vaccine: Marland Kitchen Difficulty breathing  . Swelling of face and throat  . A fast heartbeat  . A bad rash all over body  . Dizziness and weakness   Immunizations Administered    Name Date Dose VIS Date Route   Moderna COVID-19 Vaccine 02/29/2020 10:34 AM 0.5 mL 10/2019 Intramuscular   Manufacturer: Moderna   Lot: [id number]   [place][id number]

## 2020-04-27 ENCOUNTER — Other Ambulatory Visit: Payer: Self-pay

## 2020-04-27 ENCOUNTER — Encounter: Payer: Medicare HMO | Attending: Physical Medicine & Rehabilitation | Admitting: Physical Medicine & Rehabilitation

## 2020-04-27 ENCOUNTER — Encounter: Payer: Self-pay | Admitting: Physical Medicine & Rehabilitation

## 2020-04-27 VITALS — BP 130/85 | HR 64 | Temp 97.0°F | Ht 67.0 in | Wt 203.6 lb

## 2020-04-27 DIAGNOSIS — G243 Spasmodic torticollis: Secondary | ICD-10-CM | POA: Diagnosis present

## 2020-04-27 NOTE — Progress Notes (Signed)
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 Chronic cervical pain not relieved by other treatments.  Informed consent was obtained after describing risks and benefits of the procedure with the patient this included bleeding bruising and infection The patient elects to proceed and has given Written consent.  REMS form completed  Patient placed in a seated position A 27-gauge 1 inch needle electrode was used to guide the injection under EMG guidance.  Muscles and dosing:    left splenius Capitis 25 units x 3 3+ MUAP   Right Semispinalis capitus 25units x 2 sites 3+ MUAP RIght Splenius Capitus 25 units into 1 sites 3+ MUAP  Total 150U  All injections done after negative drawback for blood. Patient tolerated procedure well. Post procedure instructions given. Follow up appointment made

## 2020-04-27 NOTE — Patient Instructions (Signed)

## 2020-06-08 ENCOUNTER — Encounter: Payer: Medicare HMO | Admitting: Physical Medicine & Rehabilitation

## 2020-08-31 ENCOUNTER — Other Ambulatory Visit: Payer: Self-pay

## 2020-08-31 ENCOUNTER — Encounter: Payer: Self-pay | Admitting: Family Medicine

## 2020-08-31 ENCOUNTER — Ambulatory Visit (INDEPENDENT_AMBULATORY_CARE_PROVIDER_SITE_OTHER): Payer: Medicare HMO | Admitting: Family Medicine

## 2020-08-31 VITALS — BP 120/76 | HR 90 | Temp 98.0°F | Ht 67.0 in | Wt 191.8 lb

## 2020-08-31 DIAGNOSIS — Z9889 Other specified postprocedural states: Secondary | ICD-10-CM

## 2020-08-31 DIAGNOSIS — J4520 Mild intermittent asthma, uncomplicated: Secondary | ICD-10-CM

## 2020-08-31 DIAGNOSIS — M961 Postlaminectomy syndrome, not elsewhere classified: Secondary | ICD-10-CM | POA: Diagnosis not present

## 2020-08-31 DIAGNOSIS — F339 Major depressive disorder, recurrent, unspecified: Secondary | ICD-10-CM | POA: Diagnosis not present

## 2020-08-31 DIAGNOSIS — M5136 Other intervertebral disc degeneration, lumbar region: Secondary | ICD-10-CM | POA: Diagnosis not present

## 2020-08-31 DIAGNOSIS — Z1211 Encounter for screening for malignant neoplasm of colon: Secondary | ICD-10-CM

## 2020-08-31 DIAGNOSIS — Z23 Encounter for immunization: Secondary | ICD-10-CM

## 2020-08-31 DIAGNOSIS — Z1212 Encounter for screening for malignant neoplasm of rectum: Secondary | ICD-10-CM

## 2020-08-31 NOTE — Progress Notes (Signed)
Subjective  CC:  Chief Complaint  Patient presents with  . Establish Care    needing referral for colonoscopy  . Menstrual Problem    irregular menses    HPI: Christine Palmer is a 49 y.o. female is a former [facility] patient and is here to reestablish care with me today.   Last seen by me in 2017; reviewed records from care everywhere .had cpe in June. Reviewed labs.  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   Cervical and low back pian stable  Depression :well controlled on meds  H/o gastric bypass now again under bariatric care for weight loss   MIA controlled  H/o menorrhagia and anemia: now perimenopausal with infrequent heavy menses. No anemia on recent labs.   Assessment  1. Postlaminectomy syndrome, cervical region   2. Screening for colorectal cancer   3. Chronic recurrent major depressive disorder ([place])   4. DDD (degenerative disc disease), lumbar   5. S/P gastric surgery   6. Mild intermittent asthma without complication   7. Need for immunization against influenza      Plan   Discussed all medical problems, current treatment regimens and control. She is stable. Discussed perimnopausal changes; menses are manageable at this point. No changes in meds  Update flu vaccine  Needs colonoscopy referral placed.   Visit lasted 45 minutes with record review, lab review, face to face encounter and treatment plan/ecucation.   Follow up:  June 2022 for cpe  Orders Placed This Encounter  Procedures  . Flu Vaccine QUAD 36+ mos IM  . Ambulatory referral to Gastroenterology   No orders of the defined types were placed in this encounter.     We updated and reviewed the patient's past history in detail and it is documented below.  Patient Active Problem List   Diagnosis Date Noted  . Mild intermittent asthma 08/31/2020     Last Assessment & Plan:  Formatting of this note might be different from the original. Well-controlled with as needed albuterol.   Marland Kitchen BMI 32.0-32.9,adult 05/08/2018    Last Assessment & Plan:  Formatting of this note might be different from the original. She has regained weight lost since discontinuation of topiramate and the stressors associated with the COVID-19 pandemic. Refer back to bariatrics.   . DDD (degenerative disc disease), lumbar 03/06/2015  . AR (allergic rhinitis) 07/20/2013  . Chronic recurrent major depressive disorder ([place]) 07/20/2013    Formatting of this note might be different from the original. Treated with therapy and wellbutrin, Lorazapam, Lunesta;   Last Assessment & Plan:  Formatting of this note might be different from the original. Well-controlled on current treatment.   . S/P gastric surgery 07/20/2013    Formatting of this note might be different from the original. Gastric sleeve 2012   . Postlaminectomy syndrome, cervical region 06/21/2013   Health Maintenance  Topic Date Due  . MAMMOGRAM  Never done  . COLONOSCOPY  Never done  . TETANUS/TDAP  11/04/2020  . PAP SMEAR-Modifier  05/01/2025  . INFLUENZA VACCINE  Completed  . COVID-19 Vaccine  Completed  . Hepatitis C Screening  Completed  . HIV Screening  Completed   Immunization History  Administered Date(s) Administered  . Influenza Inj Mdck Quad Pf 08/06/2017  . Influenza, High Dose Seasonal PF 07/20/2013  . Influenza, Seasonal,  Injecte, Preservative Fre 09/15/2015, 09/11/2018  . Influenza,inj,Quad PF,6+ Mos 08/12/2019, 08/31/2020  . Influenza,inj,Quad PF,6-35 Mos 08/12/2019  . Moderna SARS-COVID-2 Vaccination 01/27/2020, 02/29/2020  . Pneumococcal Polysaccharide-23 11/04/2008  . Td 11/04/2010   Current Meds  Medication Sig  . albuterol (PROVENTIL HFA;VENTOLIN HFA) 108 (90 BASE) MCG/ACT inhaler Inhale 2 puffs into the lungs 3 (three) times daily as needed for wheezing or shortness of  breath.  Marland Kitchen buPROPion (WELLBUTRIN) 75 MG tablet Take 75 mg by mouth 2 (two) times daily.  . Calcium Carbonate-Vitamin D 600-400 MG-UNIT per tablet Take 1 tablet by mouth.  . cetirizine (ZYRTEC) 10 MG tablet Take 10 mg by mouth at bedtime.  . DULoxetine (CYMBALTA) 30 MG capsule Take 30 mg by mouth daily.  . meloxicam (MOBIC) 15 MG tablet TAKE 1 BY MOUTH DAILY WITH EVENING MEAL  . Multiple Vitamin (MULTIVITAMIN WITH MINERALS) TABS Take 1 tablet by mouth daily.  . phentermine 37.5 MG capsule Take 37.5 mg by mouth every morning. 1/2 tablet before breakfast  . tiZANidine (ZANAFLEX) 4 MG tablet TAKE 1 TABLET (4 MG TOTAL) BY MOUTH 2 (TWO) TIMES DAILY.  Marland Kitchen topiramate (TOPAMAX) 50 MG tablet   . traMADol (ULTRAM) 50 MG tablet Take 50 mg by mouth 2 (two) times daily as needed for pain.   . [DISCONTINUED] buPROPion (WELLBUTRIN XL) 150 MG 24 hr tablet   . [DISCONTINUED] diclofenac sodium (VOLTAREN) 1 % GEL Apply 1 application topically at bedtime. On neck and shoulders    Allergies: Patient is allergic to sulfa antibiotics. Past Medical History Patient  has a past medical history of Asthma and Seasonal allergies. Past Surgical History Patient  has a past surgical history that includes Laparoscopic gastric sleeve resection and Cervical fusion. Family History: Patient family history includes Alcohol abuse in her father; COPD in her mother; Diabetes in her father; Heart disease in her mother; Hypertension in her father and mother; Stroke in her father. Social History:  Patient  reports that she has never smoked. She has never used smokeless tobacco. She reports current alcohol use. She reports that she does not use drugs.  Review of Systems: Constitutional: negative for fever or malaise Ophthalmic: negative for photophobia, double vision or loss of vision Cardiovascular: negative for chest pain, dyspnea on exertion, or new LE swelling Respiratory: negative for SOB or persistent cough Gastrointestinal:  negative for abdominal pain, change in bowel habits or melena Genitourinary: negative for dysuria or gross hematuria Musculoskeletal: negative for new gait disturbance or muscular weakness Integumentary: negative for new or persistent rashes Neurological: negative for TIA or stroke symptoms Psychiatric: negative for SI or delusions Allergic/Immunologic: negative for hives  Patient Care Team    Relationship Specialty Notifications Start End  Leamon Arnt, MD PCP - General Family Medicine  08/31/20   Jessy Oto, MD Consulting Physician Orthopedic Surgery  12/12/15     Objective  Vitals: BP 120/76   Pulse 90   Temp 98 F (36.7 C) (Temporal)   Ht 5\' 7"  (1.702 m)   Wt 191 lb 12.8 oz (87 kg)   SpO2 99%   BMI 30.04 kg/m  General:  Well developed, well nourished, no acute distress  Psych:  Alert and oriented,normal mood and affect HEENT:  Normocephalic, atraumatic, non-icteric sclera, PERRL, oropharynx is without mass or exudate, supple neck without adenopathy, mass or thyromegaly Cardiovascular:  RRR without gallop, rub or murmur, nondisplaced PMI Respiratory:  Good breath sounds bilaterally, CTAB with normal respiratory effort Gastrointestinal: normal bowel sounds, soft, non-tender,  no noted masses. No HSM   Commons side effects, risks, benefits, and alternatives for medications and treatment plan prescribed today were discussed, and the patient expressed understanding of the given instructions. Patient is instructed to call or message via MyChart if he/she has any questions or concerns regarding our treatment plan. No barriers to understanding were identified. We discussed Red Flag symptoms and signs in detail. Patient expressed understanding regarding what to do in case of urgent or emergency type symptoms.   Medication list was reconciled, printed and provided to the patient in AVS. Patient instructions and summary information was reviewed with the patient as documented in the  AVS. This note was prepared with assistance of Dragon voice recognition software. Occasional wrong-word or sound-a-like substitutions may have occurred due to the inherent limitations of voice recognition software

## 2020-08-31 NOTE — Patient Instructions (Signed)
It was so good seeing you again! Thank you for establishing with my new practice and allowing me to continue caring for you. It means a lot to me.   Please schedule a follow up appointment with me in June 2022 for your annual complete physical; please come fasting.  Today you were given your flu vaccination.   I have ordered a mammogram and/or bone density for you as we discussed today: [x]   Mammogram  []   Bone Density  Please call the office checked below to schedule your appointment: Your appointment will at the following location  []   [facility]      [place], [place]         [x]   [facility]  [address] [place], [place]

## 2020-10-24 ENCOUNTER — Encounter: Payer: Self-pay | Admitting: Gastroenterology

## 2020-11-14 DIAGNOSIS — Z1231 Encounter for screening mammogram for malignant neoplasm of breast: Secondary | ICD-10-CM | POA: Diagnosis not present

## 2020-11-14 LAB — HM MAMMOGRAPHY

## 2020-11-26 ENCOUNTER — Encounter: Payer: Self-pay | Admitting: Family Medicine

## 2020-11-27 ENCOUNTER — Other Ambulatory Visit: Payer: Self-pay

## 2020-11-27 ENCOUNTER — Ambulatory Visit (AMBULATORY_SURGERY_CENTER): Payer: Medicare HMO | Admitting: *Deleted

## 2020-11-27 VITALS — Ht 67.0 in | Wt 180.0 lb

## 2020-11-27 DIAGNOSIS — Z1211 Encounter for screening for malignant neoplasm of colon: Secondary | ICD-10-CM

## 2020-11-27 NOTE — Progress Notes (Signed)
Pt verified name, DOB, address and insurance during PV today. Pt mailed instruction packet to included paper to complete and mail back to [facility] with addressed and stamped envelope, Emmi video, copy of consent form to read and not return, and instructions.PV completed over the phone. Pt encouraged to call with questions or issues - My Chart instructions to pt today   No egg or soy allergy known to patient  No issues with past sedation with any surgeries or procedures No intubation problems in the past  No FH of Malignant Hyperthermia On  diet pills per patient Phentermine- pt will stop x 10 day s No home 02 use per patient  No blood thinners per patient  Pt denies issues with constipation  No A fib or A flutter  EMMI video to pt or via [place] 19 guidelines implemented in PV today with Pt and RN  Pt is fully vaccinated  for Covid   Due to the COVID-19 pandemic we are asking patients to follow certain guidelines.  Pt aware of COVID protocols and [facility] guidelines  Pt has gastric sleeve and we discussed Golytely as this is prep that [facility] will cover- pt states she does not feel like she can drink the volume - changed to Miralax prep

## 2020-11-28 ENCOUNTER — Encounter: Payer: Self-pay | Admitting: Gastroenterology

## 2020-12-04 ENCOUNTER — Encounter: Payer: Self-pay | Admitting: Family Medicine

## 2020-12-04 ENCOUNTER — Ambulatory Visit (INDEPENDENT_AMBULATORY_CARE_PROVIDER_SITE_OTHER): Payer: Medicare HMO | Admitting: Family Medicine

## 2020-12-04 ENCOUNTER — Ambulatory Visit (INDEPENDENT_AMBULATORY_CARE_PROVIDER_SITE_OTHER): Payer: Medicare HMO

## 2020-12-04 ENCOUNTER — Other Ambulatory Visit: Payer: Self-pay

## 2020-12-04 VITALS — BP 122/78 | HR 87 | Temp 97.4°F | Resp 16 | Wt 193.2 lb

## 2020-12-04 DIAGNOSIS — R69 Illness, unspecified: Secondary | ICD-10-CM | POA: Diagnosis not present

## 2020-12-04 DIAGNOSIS — M25562 Pain in left knee: Secondary | ICD-10-CM

## 2020-12-04 DIAGNOSIS — N951 Menopausal and female climacteric states: Secondary | ICD-10-CM | POA: Diagnosis not present

## 2020-12-04 DIAGNOSIS — F339 Major depressive disorder, recurrent, unspecified: Secondary | ICD-10-CM | POA: Diagnosis not present

## 2020-12-04 DIAGNOSIS — M961 Postlaminectomy syndrome, not elsewhere classified: Secondary | ICD-10-CM

## 2020-12-04 DIAGNOSIS — Z9889 Other specified postprocedural states: Secondary | ICD-10-CM

## 2020-12-04 LAB — FOLLICLE STIMULATING HORMONE: FSH: 94.1 m[IU]/mL

## 2020-12-04 MED ORDER — GABAPENTIN 300 MG PO CAPS
300.0000 mg | ORAL_CAPSULE | Freq: Every day | ORAL | 3 refills | Status: DC
Start: 2020-12-04 — End: 2022-01-18

## 2020-12-04 MED ORDER — DICLOFENAC SODIUM 1 % EX GEL: 4.0000 g | Freq: Four times a day (QID) | CUTANEOUS | 0 refills | Status: DC | PRN

## 2020-12-04 NOTE — Patient Instructions (Signed)
Please return in June 2022 for your complete physical. Sooner if needed for knee pain.   If you have any questions or concerns, please don't hesitate to send me a message via MyChart or call the office at [phone]. Thank you for visiting with [place] today! It's our pleasure caring for you.  Take the gabapentin to help decrease your hot flushes.   Use the gel on the knee 2-4x/daily for the next few weeks.  Ice the knee 2-3 x/ day for about 10 minutes.

## 2020-12-04 NOTE — Progress Notes (Signed)
Subjective  CC:  Chief Complaint  Patient presents with  . Hot Flashes    Worsening over the past 4-5 months, increased episodes at night   . Knee Pain    Left knee, increased physical activity at the gym last month    HPI: Christine Palmer is a 50 y.o. female who presents to the office today to address the problems listed above in the chief complaint.  50 year old female who complains of nightly hot flashes interfering sleep.  Occasional hot flashes during the day.  She awakens drenched.  Last menstrual period was about 6 months ago.  She is a low cardiovascular risk patient.  No contraindications to estrogens identified.  She would like something to help her as sleep is very much affected at this point.  No other premenopausal symptoms present.  Left knee pain for about a month.  She has been working with a trainer 2 months prior 3 times per week.  She was doing things like squats, lunges jogging etc.  She describes left medial knee pain.  Sore to touch at times.  No major swelling.  Has used occasional Tylenol or Advil but nothing regular.  It is starting to feel better but still painful with certain movements.  She has not been back to exercise since it started.  No prior history of knee problems or injuries.  No locking or give way.   Assessment  1. Hot flushes, perimenopausal   2. Acute pain of left knee   3. Chronic recurrent major depressive disorder ([place])   4. Postlaminectomy syndrome, cervical region   5. S/P gastric surgery      Plan   Hot flashes perimenopausal: Counseling done regarding perimenopause and menopause.  Check FSH.  Discussed multiple options of treatment including over-the-counter medications or herbal therapies, HRT, SSRIs and gabapentin.  Patient has postlaminectomy syndrome and has used gabapentin effectively in the past.  Will start gabapentin 300 mg nightly to see if this is helpful.  Follow-up if not improving.  Depression: Now well controlled on  medications.  On chronic Cymbalta.  This can contribute to hot flushes.  Will monitor.  Knee pain: Check x-ray: Consider osteoarthritis flare, bursitis.  She cannot take NSAIDs or topical Voltaren gel and ice frequently over the next 2 weeks.  Knee brace as needed.  Follow-up if unimproved.  Follow up: As needed Visit date not found  Orders Placed This Encounter  Procedures  . DG Knee AP/LAT W/Sunrise Left  . Follicle stimulating hormone   Meds ordered this encounter  Medications  . diclofenac Sodium (VOLTAREN) 1 % GEL    Sig: Apply 4 g topically 4 (four) times daily as needed (knee pain).    Dispense:  350 g    Refill:  0  . gabapentin (NEURONTIN) 300 MG capsule    Sig: Take 1 capsule (300 mg total) by mouth at bedtime.    Dispense:  90 capsule    Refill:  3      I reviewed the patients updated PMH, FH, and SocHx.    Patient Active Problem List   Diagnosis Date Noted  . Mild intermittent asthma 08/31/2020  . DDD (degenerative disc disease), lumbar 03/06/2015  . AR (allergic rhinitis) 07/20/2013  . Chronic recurrent major depressive disorder ([place]) 07/20/2013  . S/P gastric surgery 07/20/2013  . Postlaminectomy syndrome, cervical region 06/21/2013   Current Meds  Medication Sig  . albuterol (PROVENTIL HFA;VENTOLIN HFA) 108 (90 BASE) MCG/ACT inhaler Inhale 2 puffs  into the lungs 3 (three) times daily as needed for wheezing or shortness of breath.  Marland Kitchen buPROPion (WELLBUTRIN) 75 MG tablet Take 75 mg by mouth 2 (two) times daily.  . Calcium Carbonate-Vitamin D 600-400 MG-UNIT per tablet Take 1 tablet by mouth.  . cetirizine (ZYRTEC) 10 MG tablet Take 10 mg by mouth at bedtime.  . diclofenac Sodium (VOLTAREN) 1 % GEL Apply 4 g topically 4 (four) times daily as needed (knee pain).  . DULoxetine (CYMBALTA) 30 MG capsule Take 30 mg by mouth daily.  Marland Kitchen gabapentin (NEURONTIN) 300 MG capsule Take 1 capsule (300 mg total) by mouth at bedtime.  . meloxicam (MOBIC) 15 MG tablet TAKE 1 BY  MOUTH DAILY WITH EVENING MEAL (Patient taking differently: As needed)  . Multiple Vitamin (MULTIVITAMIN WITH MINERALS) TABS Take 1 tablet by mouth daily.  . phentermine (ADIPEX-P) 37.5 MG tablet Take by mouth.  . phentermine 37.5 MG capsule Take 37.5 mg by mouth every morning. 1/2 tablet before breakfast  . tiZANidine (ZANAFLEX) 4 MG tablet TAKE 1 TABLET (4 MG TOTAL) BY MOUTH 2 (TWO) TIMES DAILY.  Marland Kitchen topiramate (TOPAMAX) 50 MG tablet   . traMADol (ULTRAM) 50 MG tablet Take 50 mg by mouth 2 (two) times daily as needed for pain.     Allergies: Patient is allergic to sulfa antibiotics. Family History: Patient family history includes Alcohol abuse in her father; COPD in her mother; Colon cancer in her maternal aunt; Colon polyps in her brother; Diabetes in her father; Heart disease in her mother; Hypertension in her father and mother; Stroke in her father. Social History:  Patient  reports that she has never smoked. She has never used smokeless tobacco. She reports current alcohol use. She reports that she does not use drugs.  Review of Systems: Constitutional: Negative for fever malaise or anorexia Cardiovascular: negative for chest pain Respiratory: negative for SOB or persistent cough Gastrointestinal: negative for abdominal pain  Objective  Vitals: BP 122/78   Pulse 87   Temp (!) 97.4 F (36.3 C) (Temporal)   Resp 16   Wt 193 lb 3.2 oz (87.6 kg)   LMP 08/04/2020   SpO2 95%   BMI 30.26 kg/m  General: no acute distress , A&Ox3, normal mood HEENT: PEERL, conjunctiva normal, neck is supple Cardiovascular:  RRR without murmur or gallop.  Respiratory:  Good breath sounds bilaterally, CTAB with normal respiratory effort Skin:  Warm, no rashes Left knee: Medial tenderness no joint line tenderness no laxity, full range of motion without crepitus, stable joint.  Normal gait     Commons side effects, risks, benefits, and alternatives for medications and treatment plan prescribed today  were discussed, and the patient expressed understanding of the given instructions. Patient is instructed to call or message via MyChart if he/she has any questions or concerns regarding our treatment plan. No barriers to understanding were identified. We discussed Red Flag symptoms and signs in detail. Patient expressed understanding regarding what to do in case of urgent or emergency type symptoms.   Medication list was reconciled, printed and provided to the patient in AVS. Patient instructions and summary information was reviewed with the patient as documented in the AVS. This note was prepared with assistance of Dragon voice recognition software. Occasional wrong-word or sound-a-like substitutions may have occurred due to the inherent limitations of voice recognition software  This visit occurred during the SARS-CoV-2 public health emergency.  Safety protocols were in place, including screening questions prior to the visit, additional usage  of staff PPE, and extensive cleaning of exam room while observing appropriate contact time as indicated for disinfecting solutions.

## 2020-12-11 ENCOUNTER — Other Ambulatory Visit: Payer: Self-pay

## 2020-12-11 ENCOUNTER — Encounter: Payer: Self-pay | Admitting: Gastroenterology

## 2020-12-11 ENCOUNTER — Ambulatory Visit (AMBULATORY_SURGERY_CENTER): Payer: Medicare HMO | Admitting: Gastroenterology

## 2020-12-11 VITALS — BP 117/75 | HR 63 | Temp 97.7°F | Resp 15 | Ht 67.0 in | Wt 180.0 lb

## 2020-12-11 DIAGNOSIS — Z1211 Encounter for screening for malignant neoplasm of colon: Secondary | ICD-10-CM

## 2020-12-11 DIAGNOSIS — E669 Obesity, unspecified: Secondary | ICD-10-CM | POA: Diagnosis not present

## 2020-12-11 DIAGNOSIS — R69 Illness, unspecified: Secondary | ICD-10-CM | POA: Diagnosis not present

## 2020-12-11 DIAGNOSIS — D124 Benign neoplasm of descending colon: Secondary | ICD-10-CM

## 2020-12-11 DIAGNOSIS — K635 Polyp of colon: Secondary | ICD-10-CM

## 2020-12-11 DIAGNOSIS — J45909 Unspecified asthma, uncomplicated: Secondary | ICD-10-CM | POA: Diagnosis not present

## 2020-12-11 MED ORDER — SODIUM CHLORIDE 0.9 % IV SOLN: 500.0000 mL | Freq: Once | INTRAVENOUS | Status: DC

## 2020-12-11 NOTE — Op Note (Signed)
[facility] Patient Name: Christine Palmer Procedure Date: 12/11/2020 8:52 AM MRN: [id number] Endoscopist: Ladene Artist , MD Age: 50 Referring MD:  Date of Birth: July 16, 1971 Gender: Female Account #: [id number] Procedure:                Colonoscopy Indications:              Screening for colorectal malignant neoplasm Medicines:                Monitored Anesthesia Care Procedure:                Pre-Anesthesia Assessment:                           - Prior to the procedure, a History and Physical                            was performed, and patient medications and                            allergies were reviewed. The patient's tolerance of                            previous anesthesia was also reviewed. The risks                            and benefits of the procedure and the sedation                            options and risks were discussed with the patient.                            All questions were answered, and informed consent                            was obtained. Prior Anticoagulants: The patient has                            taken no previous anticoagulant or antiplatelet                            agents. ASA Grade Assessment: III - A patient with                            severe systemic disease. After reviewing the risks                            and benefits, the patient was deemed in                            satisfactory condition to undergo the procedure.                           After obtaining informed consent, the colonoscope  was passed under direct vision. Throughout the                            procedure, the patient's blood pressure, pulse, and                            oxygen saturations were monitored continuously. The                            Olympus PCF-H190DL [id number]) Colonoscope was                            introduced through the anus and advanced to the the                            cecum,  identified by appendiceal orifice and                            ileocecal valve. The ileocecal valve, appendiceal                            orifice, and rectum were photographed. The quality                            of the bowel preparation was good. The colonoscopy                            was performed without difficulty. The patient                            tolerated the procedure well. Scope In: 9:23:15 AM Scope Out: 9:39:24 AM Scope Withdrawal Time: 0 hours 12 minutes 12 seconds  Total Procedure Duration: 0 hours 16 minutes 9 seconds  Findings:                 The perianal and digital rectal examinations were                            normal.                           A 3 mm polyp was found in the descending colon. The                            polyp was sessile. The polyp was removed with a                            cold biopsy forceps. Resection and retrieval were                            complete.                           Internal hemorrhoids were found during  retroflexion. The hemorrhoids were small and Grade                            I (internal hemorrhoids that do not prolapse).                           The exam was otherwise without abnormality on                            direct and retroflexion views. Complications:            No immediate complications. Estimated blood loss:                            None. Estimated Blood Loss:     Estimated blood loss: none. Impression:               - One 3 mm polyp in the descending colon, removed                            with a cold biopsy forceps. Resected and retrieved.                           - Internal hemorrhoids.                           - The examination was otherwise normal on direct                            and retroflexion views. Recommendation:           - Repeat colonoscopy after studies are complete for                            surveillance based on pathology  results.                           - Patient has a contact number available for                            emergencies. The signs and symptoms of potential                            delayed complications were discussed with the                            patient. Return to normal activities tomorrow.                            Written discharge instructions were provided to the                            patient.                           - Resume previous diet.                           -  Continue present medications.                           - Await pathology results. Ladene Artist, MD 12/11/2020 9:42:11 AM This report has been signed electronically.

## 2020-12-11 NOTE — Progress Notes (Signed)
Report to PACU, RN, vss, BBS= Clear.  

## 2020-12-11 NOTE — Patient Instructions (Signed)
Read your handouts givent o you by your recovery room nurse.  You may resume your medications today.  YOU HAD AN ENDOSCOPIC PROCEDURE TODAY AT [facility]:   Refer to the procedure report that was given to you for any specific questions about what was found during the examination.  If the procedure report does not answer your questions, please call your gastroenterologist to clarify.  If you requested that your care partner not be given the details of your procedure findings, then the procedure report has been included in a sealed envelope for you to review at your convenience later.  YOU SHOULD EXPECT: Some feelings of bloating in the abdomen. Passage of more gas than usual.  Walking can help get rid of the air that was put into your GI tract during the procedure and reduce the bloating. If you had a lower endoscopy (such as a colonoscopy or flexible sigmoidoscopy) you may notice spotting of blood in your stool or on the toilet paper. If you underwent a bowel prep for your procedure, you may not have a normal bowel movement for a few days.  Please Note:  You might notice some irritation and congestion in your nose or some drainage.  This is from the oxygen used during your procedure.  There is no need for concern and it should clear up in a day or so.  SYMPTOMS TO REPORT IMMEDIATELY:   Following lower endoscopy (colonoscopy or flexible sigmoidoscopy):  Excessive amounts of blood in the stool  Significant tenderness or worsening of abdominal pains  Swelling of the abdomen that is new, acute  Fever of 100F or higher   For urgent or emergent issues, a gastroenterologist can be reached at any hour by calling [phone]. Do not use MyChart messaging for urgent concerns.    DIET:  We do recommend a small meal at first, but then you may proceed to your regular diet.  Drink plenty of fluids but you should avoid alcoholic beverages for 24 hours. Try to eat a high fiber diet,  and ACTIVITY:  You should plan to take it easy for the rest of today and you should NOT DRIVE or use heavy machinery until tomorrow (because of the sedation medicines used during the test).    FOLLOW UP: Our staff will call the number listed on your records 48-72 hours following your procedure to check on you and address any questions or concerns that you may have regarding the information given to you following your procedure. If we do not reach you, we will leave a message.  We will attempt to reach you two times.  During this call, we will ask if you have developed any symptoms of COVID 19. If you develop any symptoms (ie: fever, flu-like symptoms, shortness of breath, cough etc.) before then, please call [phone].  If you test positive for Covid 19 in the 2 weeks post procedure, please call and report this information to [place].    If any biopsies were taken you will be contacted by phone or by letter within the next 1-3 weeks.  Please call us at [phone] if you have not heard about the biopsies in 3 weeks.    SIGNATURES/CONFIDENTIALITY: You and/or your care partner have signed paperwork which will be entered into your electronic medical record.  These signatures attest to the fact that that the information above on your After Visit Summary has been reviewed and is understood.  Full responsibility of the confidentiality of this  discharge information lies with you and/or your care-partner. 

## 2020-12-11 NOTE — Progress Notes (Signed)
VS by CW.  previsit by EM.  Pt states no changes to health hx since previsit

## 2020-12-11 NOTE — Progress Notes (Signed)
Called to room to assist during endoscopic procedure.  Patient ID and intended procedure confirmed with present staff. Received instructions for my participation in the procedure from the performing physician.  

## 2020-12-13 ENCOUNTER — Telehealth: Payer: Self-pay | Admitting: *Deleted

## 2020-12-13 NOTE — Telephone Encounter (Signed)
  Follow up Call-  Call back number 12/11/2020  Post procedure Call Back phone  # [phone]  Permission to leave phone message Yes  Some recent data might be hidden     Patient questions:  Do you have a fever, pain , or abdominal swelling? No. Pain Score  0 *  Have you tolerated food without any problems? Yes.    Have you been able to return to your normal activities? Yes.    Do you have any questions about your discharge instructions: Diet   No. Medications  No. Follow up visit  No.  Do you have questions or concerns about your Care? No.  Actions: * If pain score is 4 or above: No action needed, pain <4.  1. Have you developed a fever since your procedure? no  2.   Have you had an respiratory symptoms (SOB or cough) since your procedure? no  3.   Have you tested positive for COVID 19 since your procedure no  4.   Have you had any family members/close contacts diagnosed with the COVID 19 since your procedure?  no   If yes to any of these questions please route to Joylene John, RN and Joella Prince, RN

## 2020-12-27 ENCOUNTER — Encounter: Payer: Self-pay | Admitting: Gastroenterology

## 2021-01-05 ENCOUNTER — Other Ambulatory Visit: Payer: Self-pay | Admitting: Family Medicine

## 2021-01-17 ENCOUNTER — Telehealth: Payer: Self-pay | Admitting: Family Medicine

## 2021-01-17 NOTE — Telephone Encounter (Signed)
Left message for patient to call back and schedule Medicare Annual Wellness Visit (AWV) either virtually or in office. No detailed message left   Last AWV no information  please schedule at anytime with [facility] Nurse Health Advisor 1 or 2   This should be a 45 minute visit.

## 2021-03-08 ENCOUNTER — Other Ambulatory Visit: Payer: Self-pay

## 2021-03-08 ENCOUNTER — Encounter: Payer: Medicare HMO | Attending: Physical Medicine & Rehabilitation | Admitting: Physical Medicine & Rehabilitation

## 2021-03-08 ENCOUNTER — Telehealth: Payer: Self-pay | Admitting: Physical Medicine and Rehabilitation

## 2021-03-08 ENCOUNTER — Encounter: Payer: Self-pay | Admitting: Physical Medicine & Rehabilitation

## 2021-03-08 VITALS — BP 134/85 | HR 74 | Temp 98.5°F | Ht 67.0 in | Wt 206.0 lb

## 2021-03-08 DIAGNOSIS — G243 Spasmodic torticollis: Secondary | ICD-10-CM | POA: Diagnosis not present

## 2021-03-08 NOTE — Patient Instructions (Signed)
Rec starting exercise program again

## 2021-03-08 NOTE — Telephone Encounter (Signed)
Pt called wanting a appt. Pt was seen on 05/24/19 RFA Left L2 and L3 and L4 medial branches and left L5 dorsal rami, Okay to sch OV, please advise

## 2021-03-08 NOTE — Telephone Encounter (Signed)
Pt called wanting to set an appt for her lower L back, pt states Dr. Ernestina Patches had done obligations for her before.   [phone]

## 2021-03-08 NOTE — Telephone Encounter (Signed)
Called pt and sch OV 5/17

## 2021-03-08 NOTE — Progress Notes (Signed)
Subjective:    Patient ID: Christine Palmer, female    DOB: [birth year], 50 y.o.   MRN: [id number]  HPI CC:  Neck and shoulder stiffness and pain No falls or trauma, started working out with a trainer 1 yr ago but has not done so in ~71mo.   Symptoms started 3 week ago.  Has tried topical creams, biofreeze and voltaren, ES tylenol without relief Has also tried stretching without relief No numnbess or tingling in arm or finger  Patient states that she has pain in the left shoulder but when she points to it it is really her left upper trapezius. 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 General activity 5 Relation with others 4 Enjoyment of life 4 What TIME of day is your pain at its worst? morning  and night Sleep (in general) Fair  Pain is worse with: some activites Pain improves with: rest, medication and injections Relief from Meds: 5  Family History  Problem Relation Age of Onset  . Heart disease Mother   . Hypertension Mother   . COPD Mother   . Alcohol abuse Father   . Stroke Father   . Hypertension Father   . Diabetes Father   . Colon polyps Brother        5 yr colon's   . Colon cancer Maternal Aunt   . Esophageal cancer Neg Hx   . Rectal cancer Neg Hx   . Stomach cancer Neg Hx    Social History   Socioeconomic History  . Marital status: Single    Spouse name: Not on file  . Number of children: Not on file  . Years of education: Not on file  . Highest education level: Not on file  Occupational History  . Not on file  Tobacco Use  . Smoking status: Never Smoker  . Smokeless tobacco: Never  Used  Substance and Sexual Activity  . Alcohol use: Yes    Comment: socially   . Drug use: No  . Sexual activity: Not on file  Other Topics Concern  . Not on file  Social History Narrative  . Not on file   Social Determinants of Health   Financial Resource Strain: Not on file  Food Insecurity: Not on file  Transportation Needs: Not on file  Physical Activity: Not on file  Stress: Not on file  Social Connections: Not on file   Past Surgical History:  Procedure Laterality Date  . CERVICAL FUSION    . LAPAROSCOPIC GASTRIC SLEEVE RESECTION    . LEEP     negative results   . WISDOM TOOTH EXTRACTION     Past Surgical History:  Procedure Laterality Date  . CERVICAL FUSION    . LAPAROSCOPIC GASTRIC SLEEVE RESECTION    . LEEP     negative results   . WISDOM TOOTH EXTRACTION     Past Medical History:  Diagnosis Date  . Allergy   . Anxiety   . Arthritis    lower back   . Asthma   . Depression   . Seasonal allergies  BP 134/85   Pulse 74   Temp 98.5 F (36.9 C)   Ht 5\' 7"  (1.702 m)   Wt 206 lb (93.4 kg)   SpO2 98%   BMI 32.26 kg/m   Opioid Risk Score:   Fall Risk Score:  `1  Depression screen PHQ 2/9  Depression screen [place] 2/9 04/27/2020 01/21/2019 04/07/2018 01/05/2016  Decreased Interest 1 0 0 1  Down, Depressed, Hopeless 0 0 0 0  PHQ - 2 Score 1 0 0 1  Altered sleeping 1 - - 1  Tired, decreased energy 1 - - 1  Change in appetite 1 - - 0  Feeling bad or failure about yourself  1 - - 0  Trouble concentrating 0 - - 0  Moving slowly or fidgety/restless 0 - - 0  Suicidal thoughts 0 - - 0  PHQ-9 Score 5 - - 3  Difficult doing work/chores - - - Not difficult at all    Review of Systems  Constitutional: Negative.   HENT: Negative.   Eyes: Negative.   Respiratory: Negative.   Cardiovascular: Negative.   Gastrointestinal: Negative.   Endocrine: Negative.   Genitourinary: Negative.   Musculoskeletal: Positive for back pain and neck pain.  Skin: Negative.    Allergic/Immunologic: Negative.   Neurological: Positive for weakness.  Hematological: Negative.   Psychiatric/Behavioral: Negative.   All other systems reviewed and are negative.      Objective:   Physical Exam Vitals and nursing note reviewed.  HENT:     Head: Normocephalic and atraumatic.     Mouth/Throat:     Mouth: Mucous membranes are moist.  Eyes:     Extraocular Movements: Extraocular movements intact.     Conjunctiva/sclera: Conjunctivae normal.     Pupils: Pupils are equal, round, and reactive to light.  Musculoskeletal:        General: Tenderness present.     Comments: Tenderness left upper trapezius mild tenderness over the levator scapular area. Decreased cervical spine range of motion with flexion extension.  Mild lateral Collis to the left  Skin:    General: Skin is warm and dry.  Neurological:     General: No focal deficit present.     Mental Status: She is alert and oriented to person, place, and time.     Sensory: No sensory deficit.     Motor: No weakness.     Gait: Gait normal.  Psychiatric:        Mood and Affect: Mood normal.        Behavior: Behavior normal.           Assessment & Plan:  1.  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  We discussed resuming injections would inject 200 units next time into the following muscle groups  left splenius Capitis 25 units x 3=75u   Right Semispinalis capitus 25units x 2 sites = 50u RIght Splenius Capitus 25 units into 1 site Left trap 25 U x 2=50u

## 2021-03-08 NOTE — Telephone Encounter (Signed)
ov

## 2021-03-20 ENCOUNTER — Ambulatory Visit: Payer: Medicare HMO | Admitting: Physical Medicine and Rehabilitation

## 2021-03-20 ENCOUNTER — Other Ambulatory Visit: Payer: Self-pay

## 2021-03-20 ENCOUNTER — Encounter: Payer: Self-pay | Admitting: Physical Medicine and Rehabilitation

## 2021-03-20 VITALS — BP 141/83 | HR 62

## 2021-03-20 DIAGNOSIS — G8929 Other chronic pain: Secondary | ICD-10-CM | POA: Diagnosis not present

## 2021-03-20 DIAGNOSIS — M47816 Spondylosis without myelopathy or radiculopathy, lumbar region: Secondary | ICD-10-CM

## 2021-03-20 DIAGNOSIS — M545 Low back pain, unspecified: Secondary | ICD-10-CM | POA: Diagnosis not present

## 2021-03-20 NOTE — Progress Notes (Signed)
Left sided low back pain. Has had some pain on the right as well. Good pain relief following RFA in 2020. Pain started to return about 2 months ago and has gotten gradually worse. Numeric Pain Rating Scale and Functional Assessment Average Pain 7 Pain Right Now 8 My pain is constant, dull and aching Pain is worse with: standing and some activites Pain improves with: rest   In the last MONTH (on 0-10 scale) has pain interfered with the following?  1. General activity like being  able to carry out your everyday physical activities such as walking, climbing stairs, carrying groceries, or moving a chair?  Rating(7)  2. Relation with others like being able to carry out your usual social activities and roles such as  activities at home, at work and in your community. Rating(7)  3. Enjoyment of life such that you have  been bothered by emotional problems such as feeling anxious, depressed or irritable?  Rating(6)

## 2021-03-21 ENCOUNTER — Encounter: Payer: Self-pay | Admitting: Physical Medicine and Rehabilitation

## 2021-03-21 NOTE — Progress Notes (Signed)
Christine Palmer - 50 y.o. female MRN [id number]  Date of birth: [birth year]  Office Visit Note: Visit Date: 03/20/2021 PCP: Leamon Arnt, MD Referred by: Leamon Arnt, MD  Subjective: Chief Complaint  Patient presents with  . Lower Back - Pain   HPI: Christine Palmer is a 50 y.o. female who comes in today For evaluation management of chronic worsening severe axial left-sided more than right-sided low back pain.  Pain is predominantly left-sided with recurrence of pain over the last 3 to 4 months.  She reports 8 out of 10 back pain really limiting her daily activities.  This is despite home exercise and core strengthening with history of prolonged physical therapy for many years.  She has had no new injury or trauma.  No radicular complaints no paresthesias no weakness.  Severe low back pain worse with standing and ambulating going from sit to stand better at rest and sitting.  Medications not really helpful to any degree right now.  She has a history of cervical fusion and a history of prior radiofrequency ablation of the left L3-4 and L4-5 and L5-S1 facet joints.  These were completed in 2018 2019 and 2020.  Last ablation gave her a great amount of relief really up until the last probably 6 months and then 3 to 4 months of continued worsening pain.  She tells me the pain is very similar to that and not really anything different.  She continues to follow with Dr. Alysia Penna and physical medicine rehabilitation for spasmodic torticollis.  She had Botox injections for that in the past.  Last radiofrequency ablation procedure gave her 80 to 90% relief for over a year.  Her care is part of a comprehensive pain management approach with physical medicine rehabilitation and interventional physiatry in our office as well as access to orthopedic care and pain psychology through [facility] if needed.  She was able to reduce medications during the time of the ablation  to the point where she only took things as needed.  She was also able to increase her functionality and was doing her activities of daily living without issue.  Review of Systems  Musculoskeletal: Positive for back pain.  All other systems reviewed and are negative.  Otherwise per HPI.  Assessment & Plan: Visit Diagnoses:    ICD-10-CM   1. Spondylosis without myelopathy or radiculopathy, lumbar region  M47.816   2. Chronic left-sided low back pain without sciatica  M54.50    G89.29      Plan: Findings:  Chronic worsening severe and recalcitrant left axial low back pain consistent with facet mediated back pain with MRI imaging of facet arthritis of the lower 3 facet joints and that can be reviewed.  No high-grade stenosis or nerve compression no radicular complaints.  3 prior radiofrequency ablations all with 80 to 90% relief for over a year.  Last radiofrequency ablation performed in 2020.  She continues on home exercise program is followed by physical medicine rehabilitation and Dr. Letta Pate for spasmodic torticollis.  No new injury or trauma just return of facet mediated pain.  We will seek approval for repeat radiofrequency ablation of the left L3-4 and L4-5 and L5-S1 facet joints.  That procedure did give her 80 to 90% relief for over a year with increasing functionality and decreasing use of medication.    Meds & Orders: No orders of the defined types were placed in this encounter.  No orders of  the defined types were placed in this encounter.   Follow-up: Return for Repeat left L3-4 and L4-5 and L5-S1 radiofrequency ablation.   Procedures: No procedures performed      Clinical History: MRI LUMBAR SPINE WITHOUT CONTRAST  TECHNIQUE: Multiplanar, multisequence MR imaging of the lumbar spine was performed. No intravenous contrast was administered.  COMPARISON:  01/26/2014  FINDINGS: There is no disc abnormality in the lumbar region. No degeneration, bulge or herniation.  Patient does have mild facet degeneration throughout the lumbar region without advanced disease, edematous change, slippage or encroachment upon the neural spaces. A few of the facets are associated with small dorsal cysts, most notably on the left at L1-2, on the left at L3-4 and on the left at L4-5. None of these have any effect upon the neural structures but are simply evidence of low grade facet arthropathy. Certainly, this could be associated with back pain. Facet syndrome pain can radiate to the hips, buttocks and thighs.  IMPRESSION: No change since last year's exam. No disc pathology, stenosis or neural compression. Facet arthritis throughout the lumbar region without advanced disease. No edematous change, slippage or stenosis. Small cysts associated with some of the facets are a evidence of facet arthritis but not significant in and of themselves. Certainly, facet pain can radiate to the hips and buttocks.   Electronically Signed   By: Nelson Chimes M.D.   On: 06/05/2015 20:48   She reports that she has never smoked. She has never used smokeless tobacco. No results for input(s): HGBA1C, LABURIC in the last 8760 hours.  Objective:  VS:  HT:    WT:   BMI:     BP:(!) 141/83  HR:62bpm  TEMP: ( )  RESP:  Physical Exam Vitals and nursing note reviewed.  Constitutional:      General: She is not in acute distress.    Appearance: Normal appearance. She is not ill-appearing.  HENT:     Head: Normocephalic and atraumatic.     Right Ear: External ear normal.     Left Ear: External ear normal.  Eyes:     Extraocular Movements: Extraocular movements intact.  Cardiovascular:     Rate and Rhythm: Normal rate.     Pulses: Normal pulses.  Pulmonary:     Effort: Pulmonary effort is normal. No respiratory distress.  Abdominal:     General: There is no distension.     Palpations: Abdomen is soft.  Musculoskeletal:        General: Tenderness present.     Cervical back: Neck  supple.     Right lower leg: No edema.     Left lower leg: No edema.     Comments: Patient has good distal strength with no pain over the greater trochanters.  No clonus or focal weakness. Patient somewhat slow to rise from a seated position to full extension.  There is concordant low back pain with facet loading and lumbar spine extension rotation.  There are no definitive trigger points but the patient is somewhat tender across the lower back and PSIS.  There is no pain with hip rotation.   Skin:    Findings: No erythema, lesion or rash.  Neurological:     General: No focal deficit present.     Mental Status: She is alert and oriented to person, place, and time.     Sensory: No sensory deficit.     Motor: No weakness or abnormal muscle tone.     Coordination: Coordination normal.  Psychiatric:        Mood and Affect: Mood normal.        Behavior: Behavior normal.     Ortho Exam  Imaging: No results found.  Past Medical/Family/Surgical/Social History: Medications & Allergies reviewed per EMR, new medications updated. Patient Active Problem List   Diagnosis Date Noted  . Mild intermittent asthma 08/31/2020  . DDD (degenerative disc disease), lumbar 03/06/2015  . AR (allergic rhinitis) 07/20/2013  . 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 S/P gastric surgery 07/20/2013  . Postlaminectomy syndrome, cervical region 06/21/2013   Past Medical History:  Diagnosis Date  . Allergy   . Anxiety   . Arthritis    lower back   . Asthma   . Depression   . Seasonal allergies    Family History  Problem Relation Age of Onset  . Heart disease Mother   . Hypertension Mother   . COPD Mother   . Alcohol abuse Father   . Stroke Father   . Hypertension Father   . Diabetes Father   . Colon polyps Brother        5 yr colon's   . Colon cancer Maternal Aunt   . Esophageal cancer Neg Hx   . Rectal cancer Neg Hx   . Stomach cancer Neg Hx    Past Surgical  History:  Procedure Laterality Date  . CERVICAL FUSION    . LAPAROSCOPIC GASTRIC SLEEVE RESECTION    . LEEP     negative results   . WISDOM TOOTH EXTRACTION     Social History   Occupational History  . Not on file  Tobacco Use  . Smoking status: Never Smoker  . Smokeless tobacco: Never Used  Substance and Sexual Activity  . Alcohol use: Yes    Comment: socially   . Drug use: No  . Sexual activity: Not on file

## 2021-03-22 ENCOUNTER — Encounter: Payer: Medicare HMO | Admitting: Physical Medicine & Rehabilitation

## 2021-03-22 ENCOUNTER — Encounter: Payer: Self-pay | Admitting: Physical Medicine & Rehabilitation

## 2021-03-22 ENCOUNTER — Other Ambulatory Visit: Payer: Self-pay

## 2021-03-22 VITALS — BP 129/85 | HR 68 | Temp 98.6°F | Ht 67.0 in | Wt 207.6 lb

## 2021-03-22 DIAGNOSIS — G243 Spasmodic torticollis: Secondary | ICD-10-CM

## 2021-03-22 NOTE — Progress Notes (Signed)
Botulinum toxin injection for cervical dystonia CPT code [phone] Diagnosis code G 24.3 Indication is cervical dystonia that has not responded to conservative care and interferes with activities of daily living as well as cervical range of motion. Chronic cervical pain not relieved by other treatments.  Informed consent was obtained after describing risks and benefits of the procedure with the patient this included bleeding bruising and infection The patient elects to proceed and has given Written consent.  REMS form completed  Patient placed in a seated position A 27-gauge 1 inch needle electrode was used to guide the injection under EMG guidance.  Muscles and dosing:  left splenius Capitis 25 units x 3=75u   Right Semispinalis capitus 25units x 2 sites = 50u RIght Splenius Capitus 25 units into 1 site Left trap 25 U x 2=50u All injections done after negative drawback for blood. Patient tolerated procedure well. Post procedure instructions given. Follow up appointment made in 38mo for re injection

## 2021-03-22 NOTE — Patient Instructions (Signed)

## 2021-03-27 NOTE — Progress Notes (Signed)
Has auth been started for left L3-, L4-5, and L5-S1 RFA?

## 2021-03-28 ENCOUNTER — Ambulatory Visit: Payer: Medicare HMO | Admitting: Family Medicine

## 2021-03-29 DIAGNOSIS — H524 Presbyopia: Secondary | ICD-10-CM | POA: Diagnosis not present

## 2021-04-05 NOTE — Progress Notes (Signed)
Has auth been started for left L3-, L4-5, and L5-S1 RFA?

## 2021-04-09 DIAGNOSIS — Z0100 Encounter for examination of eyes and vision without abnormal findings: Secondary | ICD-10-CM | POA: Diagnosis not present

## 2021-04-25 ENCOUNTER — Encounter: Payer: Medicare HMO | Admitting: Family Medicine

## 2021-04-30 ENCOUNTER — Encounter: Payer: Medicare HMO | Admitting: Physical Medicine and Rehabilitation

## 2021-04-30 ENCOUNTER — Telehealth: Payer: Self-pay | Admitting: Physical Medicine and Rehabilitation

## 2021-04-30 NOTE — Telephone Encounter (Signed)
Appointment rescheduled.

## 2021-04-30 NOTE — Telephone Encounter (Signed)
Pt called to cancel 3:30 appt today. Would like to call to reschedule  CB [phone]

## 2021-05-23 ENCOUNTER — Ambulatory Visit: Payer: Self-pay

## 2021-05-23 ENCOUNTER — Ambulatory Visit: Payer: Medicare HMO | Admitting: Physical Medicine and Rehabilitation

## 2021-05-23 ENCOUNTER — Encounter: Payer: Self-pay | Admitting: Physical Medicine and Rehabilitation

## 2021-05-23 ENCOUNTER — Other Ambulatory Visit: Payer: Self-pay

## 2021-05-23 VITALS — BP 128/85 | HR 77

## 2021-05-23 DIAGNOSIS — M47816 Spondylosis without myelopathy or radiculopathy, lumbar region: Secondary | ICD-10-CM

## 2021-05-23 MED ORDER — METHYLPREDNISOLONE ACETATE 80 MG/ML IJ SUSP
80.0000 mg | Freq: Once | INTRAMUSCULAR | Status: AC
  Administered 2021-05-23: 80 mg

## 2021-05-23 NOTE — Progress Notes (Signed)
Pt state lower back pain mostly her left side. Pt state bending, walking and sitting makes the pain worse. Pt state she take over the counter pain meds and bio freeze to help ease her pain.  Numeric Pain Rating Scale and Functional Assessment Average Pain 7   In the last MONTH (on 0-10 scale) has pain interfered with the following?  1. General activity like being  able to carry out your everyday physical activities such as walking, climbing stairs, carrying groceries, or moving a chair?  Rating(8)   +Driver, -BT, -Dye Allergies.

## 2021-05-23 NOTE — Patient Instructions (Signed)

## 2021-05-29 NOTE — Progress Notes (Signed)
Christine Palmer - 50 y.o. female MRN [id number]  Date of birth: 03-Feb-1971  Office Visit Note: Visit Date: 05/23/2021 PCP: Leamon Arnt, MD Referred by: Leamon Arnt, MD  Subjective: Chief Complaint  Patient presents with   Lower Back - Pain   HPI:  Christine Palmer is a 50 y.o. female who comes in today for planned repeat radiofrequency ablation of the Left L3-L4, L4-L5, and L5-S1  Lumbar facet joints. This would be ablation of the corresponding medial branches and/or dorsal rami.  Patient has had double diagnostic blocks with more than 70% relief.  Subsequent ablation gave them more than 6 months of over 80% relief.  They have had chronic back pain for quite some time which has been an ongoing situation with recalcitrant axial back pain.  They have no radicular pain.  Their axial pain is worse with standing and ambulating and on exam today with facet loading.  They have had physical therapy as well as home exercise program.  The imaging noted in the chart below indicated facet pathology. Accordingly they meet all the criteria and qualification for for radiofrequency ablation and we are going to complete this today hopefully for more longer term relief as part of comprehensive management program.  3 prior radiofrequency ablations of the same joints have offered her a great relief over the last 4 years.  She has been getting between 11 months to a year and a half relief.  ROS Otherwise per HPI.  Assessment & Plan: Visit Diagnoses:    ICD-10-CM   1. Spondylosis without myelopathy or radiculopathy, lumbar region  M47.816 XR C-ARM NO REPORT    Radiofrequency,Lumbar    methylPREDNISolone acetate (DEPO-MEDROL) injection 80 mg      Plan: No additional findings.   Meds & Orders:  Meds ordered this encounter  Medications   methylPREDNISolone acetate (DEPO-MEDROL) injection 80 mg    Orders Placed This Encounter  Procedures   Radiofrequency,Lumbar   XR C-ARM NO REPORT    Follow-up:  Return if symptoms worsen or fail to improve.   Procedures: No procedures performed  Lumbar Facet Joint Nerve Denervation  Patient: Christine Palmer      Date of Birth: [birth year] MRN: [id number] PCP: Leamon Arnt, MD      Visit Date: 05/23/2021   Universal Protocol:    Date/Time: 07/26/226:20 AM  Consent Given By: the patient  Position: PRONE  Additional Comments: Vital signs were monitored before and after the procedure. Patient was prepped and draped in the usual sterile fashion. The correct patient, procedure, and site was verified.   Injection Procedure Details:   Procedure diagnoses:  1. Spondylosis without myelopathy or radiculopathy, lumbar region      Meds Administered:  Meds ordered this encounter  Medications   methylPREDNISolone acetate (DEPO-MEDROL) injection 80 mg     Laterality: Left  Location/Site:  L3-L4, L2 and L3 medial branches, L4-L5, L3 and L4 medial branches, and L5-S1, L4 medial branch and L5 dorsal ramus  Needle: 18 ga.,  32m active tip RF Cannula  Needle Placement: Along juncture of superior articular process and transverse pocess  Findings:  -Comments:  Procedure Details: For each desired target nerve, the corresponding transverse process (sacral ala for the L5 dorsal rami) was identified and the fluoroscope was positioned to square off the endplates of the corresponding vertebral body to achieve a true AP midline view.  The beam was then obliqued 15 to 20 degrees and caudally tilted 15 to 20  degrees to line up a trajectory along the target nerves. The skin over the target of the junction of superior articulating process and transverse process (sacral ala for the L5 dorsal rami) was infiltrated with 74m of 1% Lidocaine without Epinephrine.  The 18 gauge 146mactive tip outer cannula was advanced in trajectory view to the target.  This procedure was repeated for each target nerve.  Then, for all levels, the outer cannula placement was  fine-tuned and the position was then confirmed with bi-planar imaging.    Test stimulation was done both at sensory and motor levels to ensure there was no radicular stimulation. The target tissues were then infiltrated with 1 ml of 1% Lidocaine without Epinephrine. Subsequently, a percutaneous neurotomy was carried out for 90 seconds at 80 degrees Celsius.  After the completion of the lesion, 1 ml of injectate was delivered. It was then repeated for each facet joint nerve mentioned above. Appropriate radiographs were obtained to verify the probe placement during the neurotomy.   Additional Comments:  The patient tolerated the procedure well Dressing: 2 x 2 sterile gauze and Band-Aid    Post-procedure details: Patient was observed during the procedure. Post-procedure instructions were reviewed.  Patient left the clinic in stable condition.      Clinical History: MRI LUMBAR SPINE WITHOUT CONTRAST   TECHNIQUE: Multiplanar, multisequence MR imaging of the lumbar spine was performed. No intravenous contrast was administered.   COMPARISON:  01/26/2014   FINDINGS: There is no disc abnormality in the lumbar region. No degeneration, bulge or herniation. Patient does have mild facet degeneration throughout the lumbar region without advanced disease, edematous change, slippage or encroachment upon the neural spaces. A few of the facets are associated with small dorsal cysts, most notably on the left at L1-2, on the left at L3-4 and on the left at L4-5. None of these have any effect upon the neural structures but are simply evidence of low grade facet arthropathy. Certainly, this could be associated with back pain. Facet syndrome pain can radiate to the hips, buttocks and thighs.   IMPRESSION: No change since last year's exam. No disc pathology, stenosis or neural compression. Facet arthritis throughout the lumbar region without advanced disease. No edematous change, slippage or  stenosis. Small cysts associated with some of the facets are a evidence of facet arthritis but not significant in and of themselves. Certainly, facet pain can radiate to the hips and buttocks.     Electronically Signed   By: MaNelson Chimes.D.   On: 06/05/2015 20:48     Objective:  VS:  HT:    WT:   BMI:     BP:128/85  HR:77bpm  TEMP: ( )  RESP:  Physical Exam Vitals and nursing note reviewed.  Constitutional:      General: She is not in acute distress.    Appearance: Normal appearance. She is not ill-appearing.  HENT:     Head: Normocephalic and atraumatic.     Right Ear: External ear normal.     Left Ear: External ear normal.  Eyes:     Extraocular Movements: Extraocular movements intact.  Cardiovascular:     Rate and Rhythm: Normal rate.     Pulses: Normal pulses.  Pulmonary:     Effort: Pulmonary effort is normal. No respiratory distress.  Abdominal:     General: There is no distension.     Palpations: Abdomen is soft.  Musculoskeletal:        General: Tenderness present.  Cervical back: Neck supple.     Right lower leg: No edema.     Left lower leg: No edema.     Comments: Patient has good distal strength with no pain over the greater trochanters.  No clonus or focal weakness. Patient somewhat slow to rise from a seated position to full extension.  There is concordant low back pain with facet loading and lumbar spine extension rotation.  There are no definitive trigger points but the patient is somewhat tender across the lower back and PSIS.  There is no pain with hip rotation.   Skin:    Findings: No erythema, lesion or rash.  Neurological:     General: No focal deficit present.     Mental Status: She is alert and oriented to person, place, and time.     Sensory: No sensory deficit.     Motor: No weakness or abnormal muscle tone.     Coordination: Coordination normal.  Psychiatric:        Mood and Affect: Mood normal.        Behavior: Behavior normal.      Imaging: No results found.

## 2021-05-29 NOTE — Procedures (Signed)
Lumbar Facet Joint Nerve Denervation  Patient: Christine Palmer      Date of Birth: 1971-08-09 MRN: [id number] PCP: Leamon Arnt, MD      Visit Date: 05/23/2021   Universal Protocol:    Date/Time: 07/26/226:20 AM  Consent Given By: the patient  Position: PRONE  Additional Comments: Vital signs were monitored before and after the procedure. Patient was prepped and draped in the usual sterile fashion. The correct patient, procedure, and site was verified.   Injection Procedure Details:   Procedure diagnoses:  1. Spondylosis without myelopathy or radiculopathy, lumbar region      Meds Administered:  Meds ordered this encounter  Medications   methylPREDNISolone acetate (DEPO-MEDROL) injection 80 mg     Laterality: Left  Location/Site:  L3-L4, L2 and L3 medial branches, L4-L5, L3 and L4 medial branches, and L5-S1, L4 medial branch and L5 dorsal ramus  Needle: 18 ga.,  60m active tip RF Cannula  Needle Placement: Along juncture of superior articular process and transverse pocess  Findings:  -Comments:  Procedure Details: For each desired target nerve, the corresponding transverse process (sacral ala for the L5 dorsal rami) was identified and the fluoroscope was positioned to square off the endplates of the corresponding vertebral body to achieve a true AP midline view.  The beam was then obliqued 15 to 20 degrees and caudally tilted 15 to 20 degrees to line up a trajectory along the target nerves. The skin over the target of the junction of superior articulating process and transverse process (sacral ala for the L5 dorsal rami) was infiltrated with 150mof 1% Lidocaine without Epinephrine.  The 18 gauge 1038mctive tip outer cannula was advanced in trajectory view to the target.  This procedure was repeated for each target nerve.  Then, for all levels, the outer cannula placement was fine-tuned and the position was then confirmed with bi-planar imaging.    Test stimulation  was done both at sensory and motor levels to ensure there was no radicular stimulation. The target tissues were then infiltrated with 1 ml of 1% Lidocaine without Epinephrine. Subsequently, a percutaneous neurotomy was carried out for 90 seconds at 80 degrees Celsius.  After the completion of the lesion, 1 ml of injectate was delivered. It was then repeated for each facet joint nerve mentioned above. Appropriate radiographs were obtained to verify the probe placement during the neurotomy.   Additional Comments:  The patient tolerated the procedure well Dressing: 2 x 2 sterile gauze and Band-Aid    Post-procedure details: Patient was observed during the procedure. Post-procedure instructions were reviewed.  Patient left the clinic in stable condition.

## 2021-07-24 ENCOUNTER — Encounter: Payer: Self-pay | Admitting: Physical Medicine & Rehabilitation

## 2021-07-24 ENCOUNTER — Encounter: Payer: Medicare HMO | Attending: Physical Medicine & Rehabilitation | Admitting: Physical Medicine & Rehabilitation

## 2021-07-24 ENCOUNTER — Other Ambulatory Visit: Payer: Self-pay

## 2021-07-24 VITALS — BP 128/87 | HR 63 | Temp 98.5°F | Ht 67.0 in | Wt 212.2 lb

## 2021-07-24 DIAGNOSIS — G243 Spasmodic torticollis: Secondary | ICD-10-CM | POA: Insufficient documentation

## 2021-07-24 NOTE — Progress Notes (Signed)
Botulinum toxin injection for cervical dystonia CPT code [phone] Diagnosis code G 24.3 Indication is cervical dystonia that has not responded to conservative care and interferes with activities of daily living as well as cervical range of motion. Chronic cervical pain not relieved by other treatments.  Informed consent was obtained after describing risks and benefits of the procedure with the patient this included bleeding bruising and infection The patient elects to proceed and has given Written consent.  REMS form completed  Patient placed in a seated position A 27-gauge 1 inch needle electrode was used to guide the injection under EMG guidance.  Muscles and dosing:  left splenius Capitis 25 units x 3=75u   Right Semispinalis capitus 25units x 2 sites = 50u RIght Splenius Capitus 25 units into 1 site Left trap 25 U x 2=50u All injections done after negative drawback for blood. Patient tolerated procedure well. Post procedure instructions given. Follow up appointment made in 38mo for re injection

## 2021-10-23 ENCOUNTER — Encounter: Payer: Medicare HMO | Admitting: Physical Medicine & Rehabilitation

## 2021-11-12 DIAGNOSIS — R232 Flushing: Secondary | ICD-10-CM | POA: Diagnosis not present

## 2021-11-12 DIAGNOSIS — M25562 Pain in left knee: Secondary | ICD-10-CM | POA: Diagnosis not present

## 2021-11-12 DIAGNOSIS — Z1239 Encounter for other screening for malignant neoplasm of breast: Secondary | ICD-10-CM | POA: Diagnosis not present

## 2021-11-12 DIAGNOSIS — F339 Major depressive disorder, recurrent, unspecified: Secondary | ICD-10-CM | POA: Diagnosis not present

## 2021-11-12 DIAGNOSIS — M961 Postlaminectomy syndrome, not elsewhere classified: Secondary | ICD-10-CM | POA: Diagnosis not present

## 2021-11-12 DIAGNOSIS — M5136 Other intervertebral disc degeneration, lumbar region: Secondary | ICD-10-CM | POA: Diagnosis not present

## 2021-11-12 DIAGNOSIS — G8929 Other chronic pain: Secondary | ICD-10-CM | POA: Diagnosis not present

## 2021-11-12 DIAGNOSIS — M542 Cervicalgia: Secondary | ICD-10-CM | POA: Diagnosis not present

## 2021-11-12 DIAGNOSIS — E559 Vitamin D deficiency, unspecified: Secondary | ICD-10-CM | POA: Diagnosis not present

## 2021-11-12 DIAGNOSIS — E669 Obesity, unspecified: Secondary | ICD-10-CM | POA: Diagnosis not present

## 2021-11-12 DIAGNOSIS — R69 Illness, unspecified: Secondary | ICD-10-CM | POA: Diagnosis not present

## 2021-11-12 DIAGNOSIS — Z9884 Bariatric surgery status: Secondary | ICD-10-CM | POA: Diagnosis not present

## 2021-11-12 DIAGNOSIS — J4520 Mild intermittent asthma, uncomplicated: Secondary | ICD-10-CM | POA: Diagnosis not present

## 2021-12-05 DIAGNOSIS — Z79899 Other long term (current) drug therapy: Secondary | ICD-10-CM | POA: Diagnosis not present

## 2021-12-05 DIAGNOSIS — G243 Spasmodic torticollis: Secondary | ICD-10-CM | POA: Diagnosis not present

## 2021-12-05 DIAGNOSIS — M25562 Pain in left knee: Secondary | ICD-10-CM | POA: Diagnosis not present

## 2021-12-05 DIAGNOSIS — M961 Postlaminectomy syndrome, not elsewhere classified: Secondary | ICD-10-CM | POA: Diagnosis not present

## 2021-12-05 DIAGNOSIS — G894 Chronic pain syndrome: Secondary | ICD-10-CM | POA: Diagnosis not present

## 2021-12-05 DIAGNOSIS — M47816 Spondylosis without myelopathy or radiculopathy, lumbar region: Secondary | ICD-10-CM | POA: Diagnosis not present

## 2021-12-05 DIAGNOSIS — Z5181 Encounter for therapeutic drug level monitoring: Secondary | ICD-10-CM | POA: Diagnosis not present

## 2021-12-06 DIAGNOSIS — M1712 Unilateral primary osteoarthritis, left knee: Secondary | ICD-10-CM | POA: Diagnosis not present

## 2021-12-07 ENCOUNTER — Encounter: Payer: Medicare HMO | Admitting: Physical Medicine & Rehabilitation

## 2021-12-11 DIAGNOSIS — E669 Obesity, unspecified: Secondary | ICD-10-CM | POA: Diagnosis not present

## 2021-12-11 DIAGNOSIS — R69 Illness, unspecified: Secondary | ICD-10-CM | POA: Diagnosis not present

## 2021-12-11 DIAGNOSIS — R232 Flushing: Secondary | ICD-10-CM | POA: Diagnosis not present

## 2021-12-11 DIAGNOSIS — M1712 Unilateral primary osteoarthritis, left knee: Secondary | ICD-10-CM | POA: Diagnosis not present

## 2021-12-11 DIAGNOSIS — M961 Postlaminectomy syndrome, not elsewhere classified: Secondary | ICD-10-CM | POA: Diagnosis not present

## 2021-12-11 DIAGNOSIS — Z5941 Food insecurity: Secondary | ICD-10-CM | POA: Diagnosis not present

## 2021-12-11 DIAGNOSIS — F339 Major depressive disorder, recurrent, unspecified: Secondary | ICD-10-CM | POA: Diagnosis not present

## 2021-12-13 DIAGNOSIS — Z1231 Encounter for screening mammogram for malignant neoplasm of breast: Secondary | ICD-10-CM | POA: Diagnosis not present

## 2021-12-21 DIAGNOSIS — L448 Other specified papulosquamous disorders: Secondary | ICD-10-CM | POA: Diagnosis not present

## 2021-12-21 DIAGNOSIS — L218 Other seborrheic dermatitis: Secondary | ICD-10-CM | POA: Diagnosis not present

## 2021-12-21 DIAGNOSIS — L239 Allergic contact dermatitis, unspecified cause: Secondary | ICD-10-CM | POA: Diagnosis not present

## 2022-01-10 DIAGNOSIS — Z0000 Encounter for general adult medical examination without abnormal findings: Secondary | ICD-10-CM | POA: Diagnosis not present

## 2022-01-18 ENCOUNTER — Encounter: Payer: Self-pay | Admitting: Physical Medicine & Rehabilitation

## 2022-01-18 ENCOUNTER — Other Ambulatory Visit: Payer: Self-pay

## 2022-01-18 ENCOUNTER — Encounter: Payer: Medicare HMO | Attending: Physical Medicine & Rehabilitation | Admitting: Physical Medicine & Rehabilitation

## 2022-01-18 VITALS — BP 127/88 | HR 85 | Temp 98.6°F | Ht 67.0 in | Wt 211.0 lb

## 2022-01-18 DIAGNOSIS — G243 Spasmodic torticollis: Secondary | ICD-10-CM | POA: Diagnosis not present

## 2022-01-18 NOTE — Progress Notes (Signed)
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 Chronic cervical pain not relieved by other treatments.  Informed consent was obtained after describing risks and benefits of the procedure with the patient this included bleeding bruising and infection The patient elects to proceed and has given Written consent.  REMS form completed  Patient placed in a seated position A 27-gauge 1 inch needle electrode was used to guide the injection under EMG guidance.  Muscles and dosing: Total dose 200U Botox left splenius Capitis 25 units x 3=75u   Right Semispinalis capitus 25units x 2 sites = 50u RIght Splenius Capitus 25 units into 1 site Left trap 25 U x 2=50u All injections done after negative drawback for blood. Patient tolerated procedure well. Post procedure instructions given. Follow up appointment made in 4mo for re injection  

## 2022-01-18 NOTE — Patient Instructions (Signed)

## 2022-01-30 DIAGNOSIS — L218 Other seborrheic dermatitis: Secondary | ICD-10-CM | POA: Diagnosis not present

## 2022-01-30 DIAGNOSIS — L448 Other specified papulosquamous disorders: Secondary | ICD-10-CM | POA: Diagnosis not present

## 2022-01-30 DIAGNOSIS — L239 Allergic contact dermatitis, unspecified cause: Secondary | ICD-10-CM | POA: Diagnosis not present

## 2022-02-25 IMAGING — DX DG KNEE AP/LAT W/ SUNRISE*L*
4 series · 4 of 4 positions shown · non-contrast
Comparison: None.

CLINICAL DATA: Left knee pain

EXAM:
LEFT KNEE 3 VIEWS

[knee standing ap]
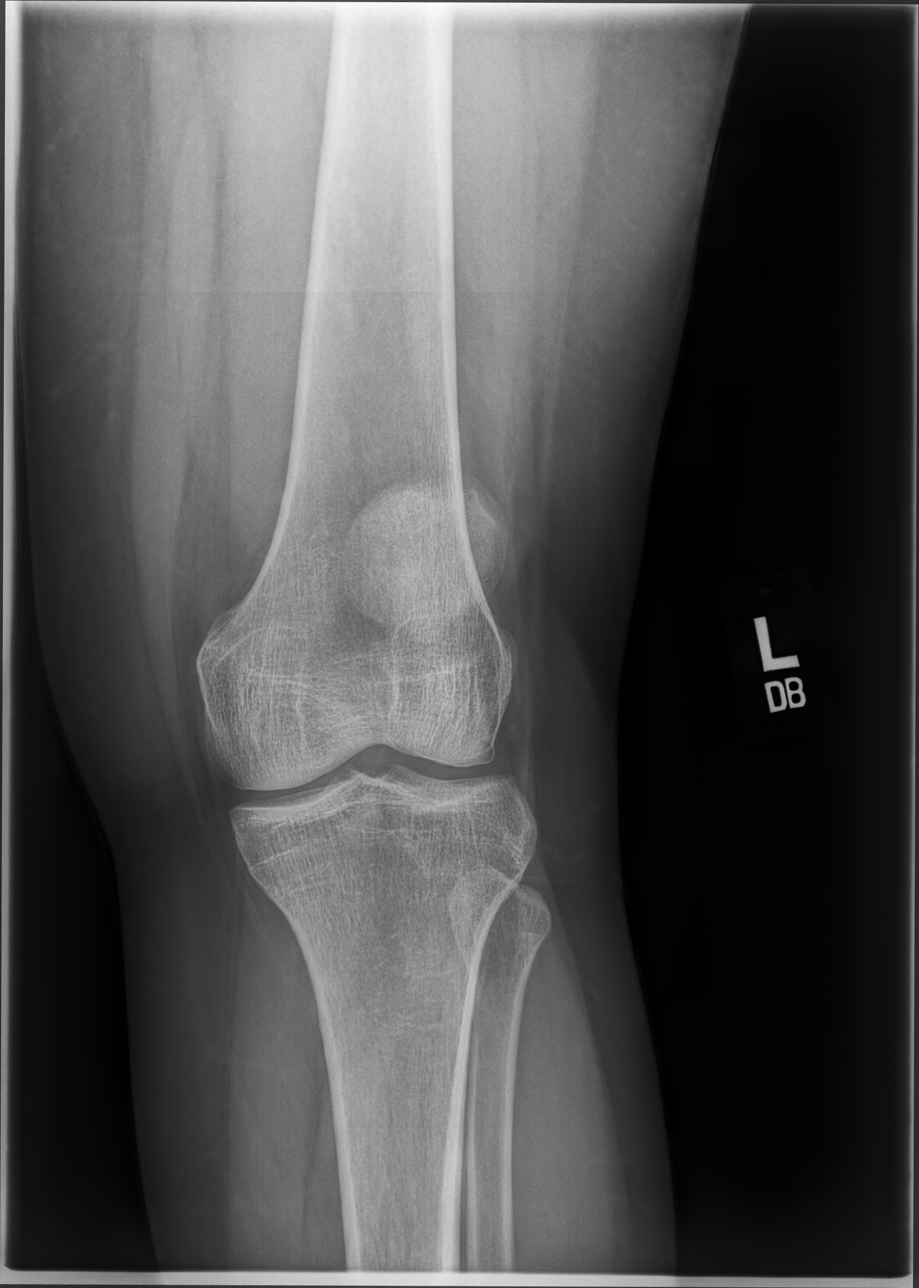

[knee standing lat]
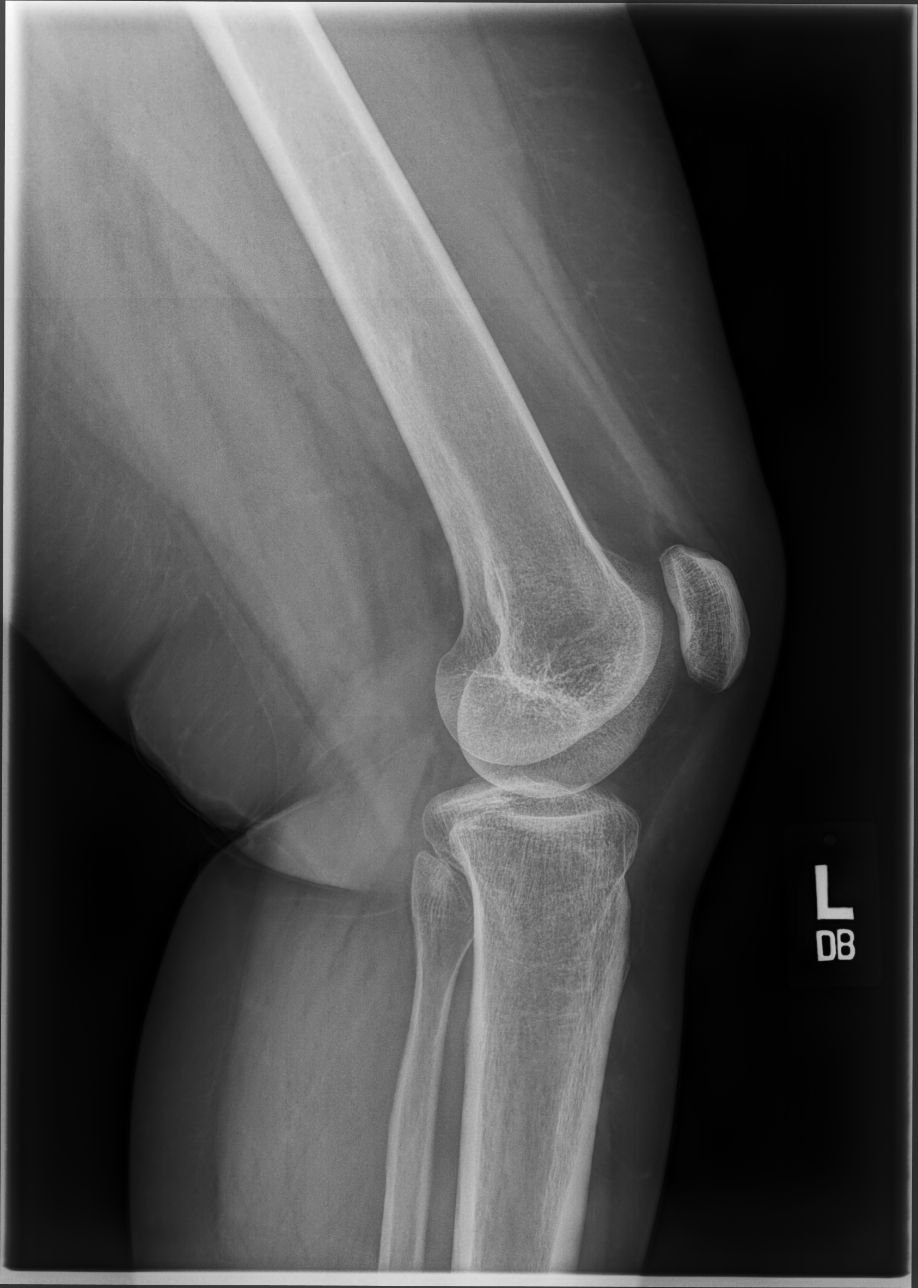

[sunrise]
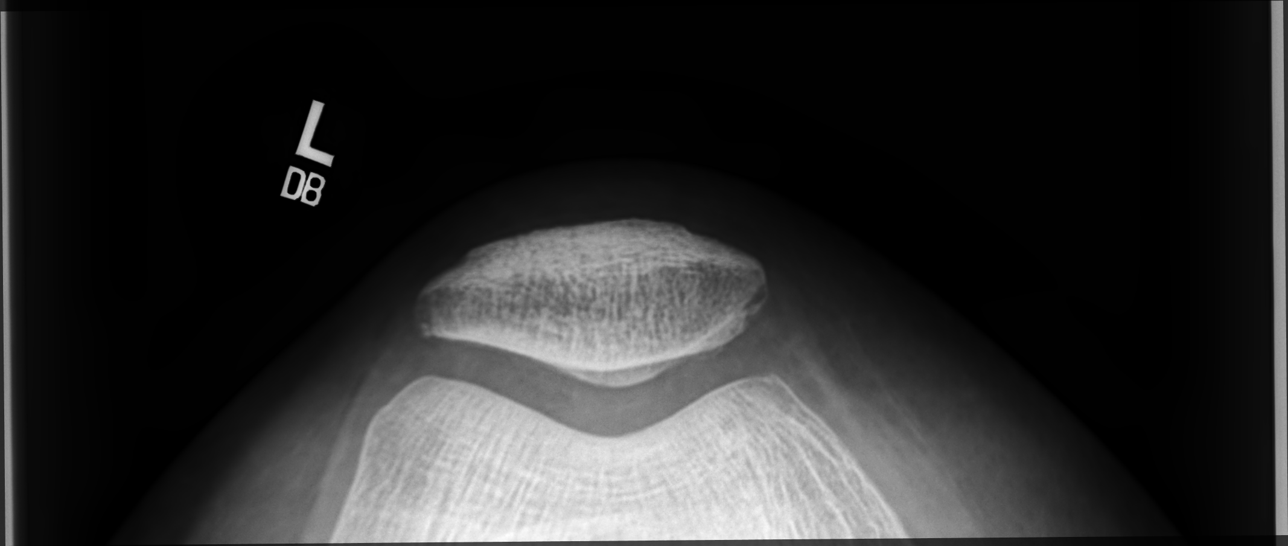

[patella lat]
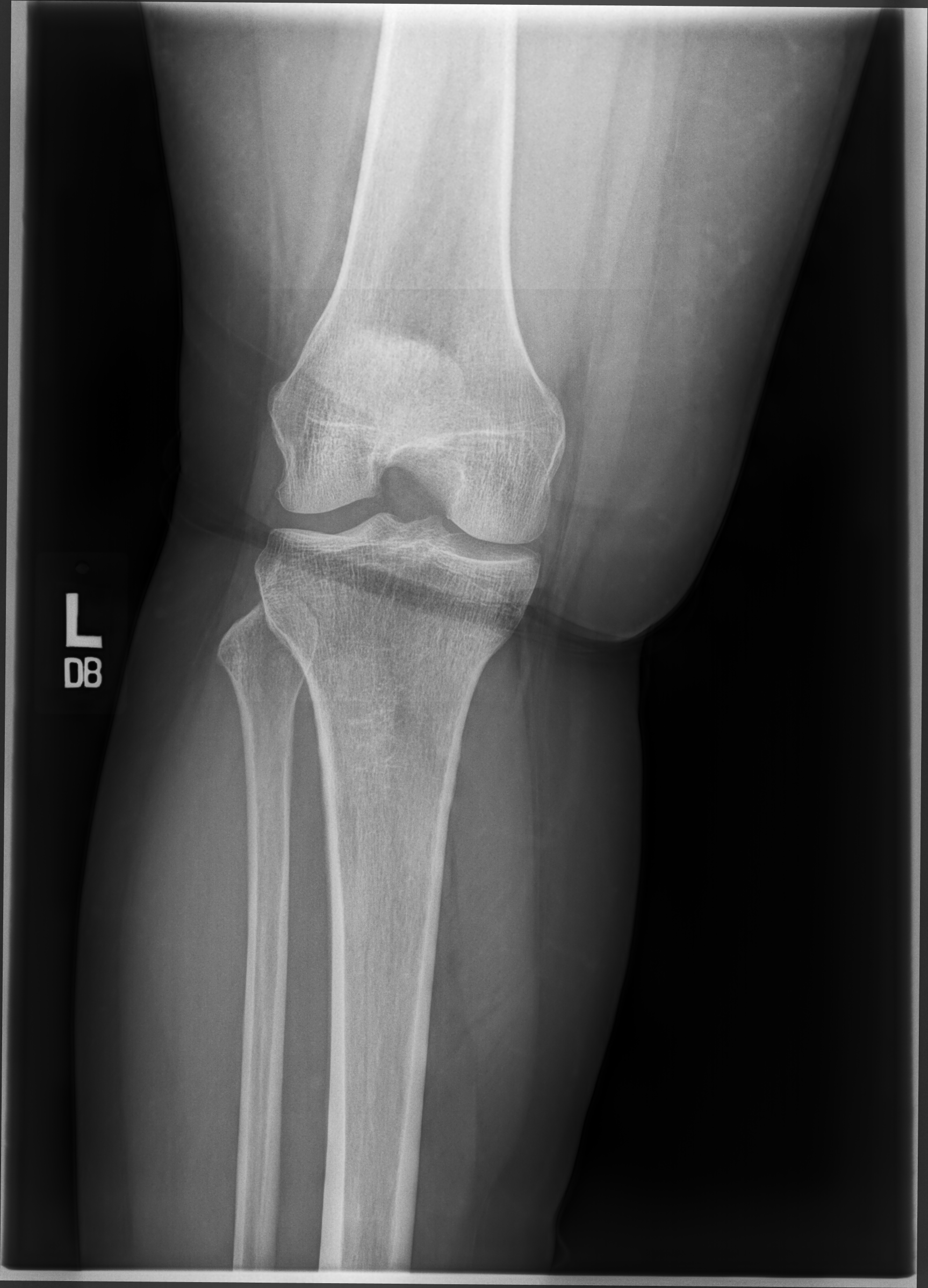

[4 of 4 positions shown; findings below may reference images not displayed]

FINDINGS: No evidence of fracture, dislocation, or joint effusion. No evidence
of arthropathy or other focal bone abnormality. Soft tissues are
unremarkable.
IMPRESSION: No acute osseous injury of the left knee.

## 2022-03-20 DIAGNOSIS — E669 Obesity, unspecified: Secondary | ICD-10-CM | POA: Diagnosis not present

## 2022-03-20 DIAGNOSIS — Z6830 Body mass index (BMI) 30.0-30.9, adult: Secondary | ICD-10-CM | POA: Diagnosis not present

## 2022-03-20 DIAGNOSIS — Z9884 Bariatric surgery status: Secondary | ICD-10-CM | POA: Diagnosis not present

## 2022-04-05 DIAGNOSIS — F339 Major depressive disorder, recurrent, unspecified: Secondary | ICD-10-CM | POA: Diagnosis not present

## 2022-04-05 DIAGNOSIS — E669 Obesity, unspecified: Secondary | ICD-10-CM | POA: Diagnosis not present

## 2022-04-05 DIAGNOSIS — R69 Illness, unspecified: Secondary | ICD-10-CM | POA: Diagnosis not present

## 2022-04-05 DIAGNOSIS — Z124 Encounter for screening for malignant neoplasm of cervix: Secondary | ICD-10-CM | POA: Diagnosis not present

## 2022-04-05 DIAGNOSIS — J4520 Mild intermittent asthma, uncomplicated: Secondary | ICD-10-CM | POA: Diagnosis not present

## 2022-04-05 DIAGNOSIS — Z9884 Bariatric surgery status: Secondary | ICD-10-CM | POA: Diagnosis not present

## 2022-04-08 DIAGNOSIS — R69 Illness, unspecified: Secondary | ICD-10-CM | POA: Diagnosis not present

## 2022-04-26 ENCOUNTER — Encounter: Payer: Medicare HMO | Admitting: Physical Medicine & Rehabilitation

## 2022-05-16 DIAGNOSIS — R42 Dizziness and giddiness: Secondary | ICD-10-CM | POA: Diagnosis not present

## 2022-05-16 DIAGNOSIS — E860 Dehydration: Secondary | ICD-10-CM | POA: Diagnosis not present

## 2022-05-21 DIAGNOSIS — Z981 Arthrodesis status: Secondary | ICD-10-CM | POA: Diagnosis not present

## 2022-05-21 DIAGNOSIS — S0990XA Unspecified injury of head, initial encounter: Secondary | ICD-10-CM | POA: Diagnosis not present

## 2022-05-21 DIAGNOSIS — S60419A Abrasion of unspecified finger, initial encounter: Secondary | ICD-10-CM | POA: Diagnosis not present

## 2022-05-21 DIAGNOSIS — S0993XA Unspecified injury of face, initial encounter: Secondary | ICD-10-CM | POA: Diagnosis not present

## 2022-05-21 DIAGNOSIS — S298XXA Other specified injuries of thorax, initial encounter: Secondary | ICD-10-CM | POA: Diagnosis not present

## 2022-05-21 DIAGNOSIS — S199XXA Unspecified injury of neck, initial encounter: Secondary | ICD-10-CM | POA: Diagnosis not present

## 2022-05-21 DIAGNOSIS — R42 Dizziness and giddiness: Secondary | ICD-10-CM | POA: Diagnosis not present

## 2022-05-21 DIAGNOSIS — R69 Illness, unspecified: Secondary | ICD-10-CM | POA: Diagnosis not present

## 2022-05-21 DIAGNOSIS — S0003XA Contusion of scalp, initial encounter: Secondary | ICD-10-CM | POA: Diagnosis not present

## 2022-05-21 DIAGNOSIS — T07XXXA Unspecified multiple injuries, initial encounter: Secondary | ICD-10-CM | POA: Diagnosis not present

## 2022-05-21 DIAGNOSIS — R519 Headache, unspecified: Secondary | ICD-10-CM | POA: Diagnosis not present

## 2022-05-21 DIAGNOSIS — M542 Cervicalgia: Secondary | ICD-10-CM | POA: Diagnosis not present

## 2022-05-21 DIAGNOSIS — Z9889 Other specified postprocedural states: Secondary | ICD-10-CM | POA: Diagnosis not present

## 2022-05-22 DIAGNOSIS — R69 Illness, unspecified: Secondary | ICD-10-CM | POA: Diagnosis not present

## 2022-05-25 DIAGNOSIS — R69 Illness, unspecified: Secondary | ICD-10-CM | POA: Diagnosis not present

## 2022-06-01 DIAGNOSIS — R69 Illness, unspecified: Secondary | ICD-10-CM | POA: Diagnosis not present

## 2022-06-13 DIAGNOSIS — R69 Illness, unspecified: Secondary | ICD-10-CM | POA: Diagnosis not present

## 2022-06-14 ENCOUNTER — Encounter: Payer: Medicare HMO | Attending: Physical Medicine & Rehabilitation | Admitting: Physical Medicine & Rehabilitation

## 2022-06-14 ENCOUNTER — Encounter: Payer: Self-pay | Admitting: Physical Medicine & Rehabilitation

## 2022-06-14 VITALS — BP 128/91 | HR 73 | Ht 67.0 in | Wt 176.6 lb

## 2022-06-14 DIAGNOSIS — G243 Spasmodic torticollis: Secondary | ICD-10-CM | POA: Insufficient documentation

## 2022-06-14 NOTE — Progress Notes (Signed)
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 Chronic cervical pain not relieved by other treatments.  Informed consent was obtained after describing risks and benefits of the procedure with the patient this included bleeding bruising and infection The patient elects to proceed and has given Written consent.  REMS form completed  Patient placed in a seated position A 27-gauge 1 inch needle electrode was used to guide the injection under EMG guidance.  Muscles and dosing: Total dose 200U Botox left splenius Capitis 25 units x 3=75u   Right Semispinalis capitus 25units x 2 sites = 50u RIght Splenius Capitus 25 units into 1 site Left trap 25 U x 2=50u All injections done after negative drawback for blood. Patient tolerated procedure well. Post procedure instructions given. Follow up appointment made in 4mo for re injection  

## 2022-06-27 DIAGNOSIS — R69 Illness, unspecified: Secondary | ICD-10-CM | POA: Diagnosis not present

## 2022-07-18 DIAGNOSIS — R69 Illness, unspecified: Secondary | ICD-10-CM | POA: Diagnosis not present

## 2022-07-29 ENCOUNTER — Encounter: Payer: Self-pay | Admitting: *Deleted

## 2022-08-01 DIAGNOSIS — R69 Illness, unspecified: Secondary | ICD-10-CM | POA: Diagnosis not present

## 2022-08-06 DIAGNOSIS — R69 Illness, unspecified: Secondary | ICD-10-CM | POA: Diagnosis not present

## 2022-08-09 DIAGNOSIS — R69 Illness, unspecified: Secondary | ICD-10-CM | POA: Diagnosis not present

## 2022-08-13 DIAGNOSIS — R69 Illness, unspecified: Secondary | ICD-10-CM | POA: Diagnosis not present

## 2022-08-16 DIAGNOSIS — R69 Illness, unspecified: Secondary | ICD-10-CM | POA: Diagnosis not present

## 2022-08-20 DIAGNOSIS — R69 Illness, unspecified: Secondary | ICD-10-CM | POA: Diagnosis not present

## 2022-08-23 DIAGNOSIS — R69 Illness, unspecified: Secondary | ICD-10-CM | POA: Diagnosis not present

## 2022-08-27 DIAGNOSIS — R69 Illness, unspecified: Secondary | ICD-10-CM | POA: Diagnosis not present

## 2022-08-30 DIAGNOSIS — R69 Illness, unspecified: Secondary | ICD-10-CM | POA: Diagnosis not present

## 2022-09-03 DIAGNOSIS — R69 Illness, unspecified: Secondary | ICD-10-CM | POA: Diagnosis not present

## 2022-09-06 DIAGNOSIS — R69 Illness, unspecified: Secondary | ICD-10-CM | POA: Diagnosis not present

## 2022-09-10 DIAGNOSIS — R69 Illness, unspecified: Secondary | ICD-10-CM | POA: Diagnosis not present

## 2022-09-13 DIAGNOSIS — R69 Illness, unspecified: Secondary | ICD-10-CM | POA: Diagnosis not present

## 2022-09-17 DIAGNOSIS — R69 Illness, unspecified: Secondary | ICD-10-CM | POA: Diagnosis not present

## 2022-09-24 DIAGNOSIS — R69 Illness, unspecified: Secondary | ICD-10-CM | POA: Diagnosis not present

## 2022-10-01 DIAGNOSIS — R69 Illness, unspecified: Secondary | ICD-10-CM | POA: Diagnosis not present

## 2022-10-15 ENCOUNTER — Encounter: Payer: Medicare HMO | Admitting: Physical Medicine & Rehabilitation

## 2022-10-17 ENCOUNTER — Encounter: Payer: Self-pay | Admitting: *Deleted

## 2022-10-29 ENCOUNTER — Encounter: Payer: Medicare HMO | Admitting: Physical Medicine & Rehabilitation

## 2022-12-12 ENCOUNTER — Encounter: Payer: Medicare HMO | Attending: Physical Medicine & Rehabilitation | Admitting: Physical Medicine & Rehabilitation

## 2022-12-12 ENCOUNTER — Encounter: Payer: Self-pay | Admitting: Physical Medicine & Rehabilitation

## 2022-12-12 VITALS — BP 132/85 | HR 84 | Temp 98.3°F | Ht 67.0 in | Wt 221.0 lb

## 2022-12-12 DIAGNOSIS — G243 Spasmodic torticollis: Secondary | ICD-10-CM | POA: Diagnosis not present

## 2022-12-12 MED ORDER — ONABOTULINUMTOXINA 100 UNITS IJ SOLR
200.0000 [IU] | Freq: Once | INTRAMUSCULAR | Status: AC
  Administered 2022-12-12: 200 [IU] via INTRAMUSCULAR

## 2022-12-12 NOTE — Patient Instructions (Signed)

## 2022-12-12 NOTE — Progress Notes (Signed)
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 Chronic cervical pain not relieved by other treatments.  Informed consent was obtained after describing risks and benefits of the procedure with the patient this included bleeding bruising and infection The patient elects to proceed and has given Written consent.  REMS form completed  Patient placed in a seated position A 27-gauge 1 inch needle electrode was used to guide the injection under EMG guidance.  Muscles and dosing: Total dose 200U Botox left splenius Capitis 25 units x 3=75u   Right Semispinalis capitus 25units x 2 sites = 50u RIght Splenius Capitus 25 units into 1 site Left trap 25 U x 2=50u All injections done after negative drawback for blood. Patient tolerated procedure well. Post procedure instructions given. Follow up appointment made in 44mofor re injection

## 2023-02-26 ENCOUNTER — Other Ambulatory Visit (INDEPENDENT_AMBULATORY_CARE_PROVIDER_SITE_OTHER): Payer: Medicare HMO

## 2023-02-26 ENCOUNTER — Encounter: Payer: Self-pay | Admitting: Orthopaedic Surgery

## 2023-02-26 ENCOUNTER — Ambulatory Visit (INDEPENDENT_AMBULATORY_CARE_PROVIDER_SITE_OTHER): Payer: Medicare HMO | Admitting: Orthopaedic Surgery

## 2023-02-26 DIAGNOSIS — M67472 Ganglion, left ankle and foot: Secondary | ICD-10-CM | POA: Diagnosis not present

## 2023-02-26 MED ORDER — METHYLPREDNISOLONE ACETATE 40 MG/ML IJ SUSP
10.0000 mg | INTRAMUSCULAR | Status: AC | PRN
Start: 2023-02-26 — End: 2023-02-26
  Administered 2023-02-26: 10 mg via INTRA_ARTICULAR

## 2023-02-26 NOTE — Progress Notes (Signed)
Office Visit Note   Patient: Christine Palmer           Date of Birth: 12-31-1970           MRN: [id number] Visit Date: 02/26/2023              Requested by: Willow Ora, MD [address] [place],  [place] [address] PCP: Willow Ora, MD   Assessment & Plan: Visit Diagnoses:  1. Ganglion cyst of left foot     Plan: Impression is left foot ganglion cyst.  Aspiration and steroid injection performed.  Classic ganglion cystic fluid expressed with immediate decompression of the cyst.  Patient tolerated well.  Follow up as needed.  Follow-Up Instructions: No follow-ups on file.   Orders:  No orders of the defined types were placed in this encounter.  No orders of the defined types were placed in this encounter.     Procedures: Medium Joint Inj: L ankle on 02/26/2023 12:29 PM Medications: 10 mg methylPREDNISolone acetate 40 MG/ML      Clinical Data: No additional findings.   Subjective: Chief Complaint  Patient presents with   Left Foot - Pain    HPI  Patient is a pleasant 52 year old female here for painful dorsal left foot nodule that has gotten bigger.  She has had this for a while but is starting to bother her with shoe wear.  Review of Systems  Constitutional: Negative.   HENT: Negative.    Eyes: Negative.   Respiratory: Negative.    Cardiovascular: Negative.   Endocrine: Negative.   Musculoskeletal: Negative.   Neurological: Negative.   Hematological: Negative.   Psychiatric/Behavioral: Negative.    All other systems reviewed and are negative.    Objective: Vital Signs: There were no vitals taken for this visit.  Physical Exam Vitals and nursing note reviewed.  Constitutional:      Appearance: She is well-developed.  HENT:     Head: Atraumatic.     Nose: Nose normal.  Eyes:     Extraocular Movements: Extraocular movements intact.  Cardiovascular:     Pulses: Normal pulses.  Pulmonary:     Effort: Pulmonary effort is normal.   Abdominal:     Palpations: Abdomen is soft.  Musculoskeletal:     Cervical back: Neck supple.  Skin:    General: Skin is warm.     Capillary Refill: Capillary refill takes less than 2 seconds.  Neurological:     Mental Status: She is alert. Mental status is at baseline.  Psychiatric:        Behavior: Behavior normal.        Thought Content: Thought content normal.        Judgment: Judgment normal.     Ortho Exam  Left foot - small dorsal ganglion cyst overlying midfoot, semi-mobile - NVI   Specialty Comments:  No specialty comments available.  Imaging: No results found.   PMFS History: Patient Active Problem List   Diagnosis Date Noted   Ganglion cyst of left foot 02/26/2023   Mild intermittent asthma 08/31/2020   DDD (degenerative disc disease), lumbar 03/06/2015   AR (allergic rhinitis) 07/20/2013   Chronic recurrent major depressive disorder 07/20/2013   S/P gastric surgery 07/20/2013   Postlaminectomy syndrome, cervical region 06/21/2013   Past Medical History:  Diagnosis Date   Allergy    Anxiety    Arthritis    lower back    Asthma    Depression    Seasonal  allergies     Family History  Problem Relation Age of Onset   Heart disease Mother    Hypertension Mother    COPD Mother    Alcohol abuse Father    Stroke Father    Hypertension Father    Diabetes Father    Colon polyps Brother        5 yr colon's    Colon cancer Maternal Aunt    Esophageal cancer Neg Hx    Rectal cancer Neg Hx    Stomach cancer Neg Hx     Past Surgical History:  Procedure Laterality Date   CERVICAL FUSION     LAPAROSCOPIC GASTRIC SLEEVE RESECTION     LEEP     negative results    WISDOM TOOTH EXTRACTION     Social History   Occupational History   Not on file  Tobacco Use   Smoking status: Never   Smokeless tobacco: Never  Vaping Use   Vaping Use: Never used  Substance and Sexual Activity   Alcohol use: Yes    Comment: socially    Drug use: No   Sexual  activity: Not on file

## 2023-03-03 ENCOUNTER — Telehealth: Payer: Self-pay

## 2023-03-03 NOTE — Telephone Encounter (Signed)
Apply some ice and take advil for a week.  Should resolve.  Just a little inflammation from the procedure.

## 2023-03-03 NOTE — Telephone Encounter (Signed)
Pt called and states that since her appt with Dr. Roda Shutters last week where she had a ganglion cyst drained at her appt last Wednesday she has been having shooting pain through her GT. Pt wants to know if she should be concerned and advised what next steps should be. Please call [phone]

## 2023-03-04 NOTE — Telephone Encounter (Signed)
Called and relayed information to patient.

## 2023-04-11 ENCOUNTER — Encounter: Payer: Medicare HMO | Admitting: Physical Medicine & Rehabilitation

## 2023-05-15 ENCOUNTER — Ambulatory Visit: Payer: Medicare HMO | Admitting: Family Medicine

## 2023-05-16 ENCOUNTER — Encounter: Payer: Medicare HMO | Attending: Physical Medicine & Rehabilitation | Admitting: Physical Medicine & Rehabilitation

## 2023-05-16 ENCOUNTER — Encounter: Payer: Self-pay | Admitting: Physical Medicine & Rehabilitation

## 2023-05-16 VITALS — BP 134/81 | HR 70 | Ht 67.0 in | Wt 232.0 lb

## 2023-05-16 DIAGNOSIS — G243 Spasmodic torticollis: Secondary | ICD-10-CM | POA: Insufficient documentation

## 2023-05-16 MED ORDER — ONABOTULINUMTOXINA 100 UNITS IJ SOLR
200.0000 [IU] | Freq: Once | INTRAMUSCULAR | Status: AC
  Administered 2023-05-16: 200 [IU] via INTRAMUSCULAR

## 2023-05-16 NOTE — Progress Notes (Signed)
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 Chronic cervical pain not relieved by other treatments.  Informed consent was obtained after describing risks and benefits of the procedure with the patient this included bleeding bruising and infection The patient elects to proceed and has given Written consent.  REMS form completed  Patient placed in a seated position A 27-gauge 1 inch needle electrode was used to guide the injection under EMG guidance.  Muscles and dosing: Total dose 200U Botox left splenius Capitis 25 units x 3=75u   Right Semispinalis capitus 25units x 2 sites = 50u RIght Splenius Capitus 25 units into 1 site Left trap 25 U x 2=50u All injections done after negative drawback for blood. Patient tolerated procedure well. Post procedure instructions given. Follow up appointment made in 4mo for re injection  

## 2023-08-19 ENCOUNTER — Encounter: Payer: Self-pay | Admitting: Physical Medicine & Rehabilitation

## 2023-08-19 ENCOUNTER — Encounter: Payer: Medicare HMO | Attending: Physical Medicine & Rehabilitation | Admitting: Physical Medicine & Rehabilitation

## 2023-08-19 VITALS — BP 135/86 | Temp 98.3°F | Ht 67.0 in | Wt 249.0 lb

## 2023-08-19 DIAGNOSIS — G243 Spasmodic torticollis: Secondary | ICD-10-CM | POA: Insufficient documentation

## 2023-08-19 MED ORDER — ONABOTULINUMTOXINA 100 UNITS IJ SOLR
100.0000 [IU] | Freq: Once | INTRAMUSCULAR | Status: AC
Start: 2023-08-19 — End: 2023-08-19
  Administered 2023-08-19: 100 [IU] via INTRAMUSCULAR

## 2023-08-19 MED ORDER — SODIUM CHLORIDE (PF) 0.9 % IJ SOLN
3.0000 mL | Freq: Once | INTRAMUSCULAR | Status: AC
Start: 2023-08-19 — End: 2023-08-19
  Administered 2023-08-19: 3 mL via INTRAVENOUS

## 2023-08-19 NOTE — Progress Notes (Signed)
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 Chronic cervical pain not relieved by other treatments.  Informed consent was obtained after describing risks and benefits of the procedure with the patient this included bleeding bruising and infection The patient elects to proceed and has given Written consent.  REMS form completed  Patient placed in a seated position A 27-gauge 1 inch needle electrode was used to guide the injection under EMG guidance.  Muscles and dosing: Total dose 200U Botox left splenius Capitis 25 units x 3=75u   Right Semispinalis capitus 25units x 2 sites = 50u RIght Splenius Capitus 25 units into 1 site Left trap 25 U x 2=50u All injections done after negative drawback for blood. Patient tolerated procedure well. Post procedure instructions given. Follow up appointment made in 61mo for re injection

## 2023-12-23 ENCOUNTER — Encounter: Payer: Medicare HMO | Attending: Physical Medicine & Rehabilitation | Admitting: Physical Medicine & Rehabilitation

## 2023-12-23 ENCOUNTER — Encounter: Payer: Self-pay | Admitting: Physical Medicine & Rehabilitation

## 2023-12-23 VITALS — BP 124/83 | HR 86 | Ht 67.0 in | Wt 249.0 lb

## 2023-12-23 DIAGNOSIS — G243 Spasmodic torticollis: Secondary | ICD-10-CM | POA: Insufficient documentation

## 2023-12-23 MED ORDER — SODIUM CHLORIDE (PF) 0.9 % IJ SOLN
4.0000 mL | Freq: Once | INTRAMUSCULAR | Status: AC
Start: 2023-12-23 — End: 2023-12-23
  Administered 2023-12-23: 4 mL via INTRAVENOUS

## 2023-12-23 MED ORDER — ONABOTULINUMTOXINA 100 UNITS IJ SOLR
100.0000 [IU] | Freq: Once | INTRAMUSCULAR | Status: AC
Start: 2023-12-23 — End: 2023-12-23
  Administered 2023-12-23: 100 [IU] via INTRAMUSCULAR

## 2023-12-23 NOTE — Patient Instructions (Signed)

## 2023-12-23 NOTE — Progress Notes (Signed)
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 Chronic cervical pain not relieved by other treatments.  Informed consent was obtained after describing risks and benefits of the procedure with the patient this included bleeding bruising and infection The patient elects to proceed and has given Written consent.  REMS form completed  Patient placed in a seated position A 27-gauge 1 inch needle electrode was used to guide the injection under EMG guidance.  Muscles and dosing: Total dose 200U Botox left splenius Capitis 25 units x 3=75u   Right Semispinalis capitus 25units x 2 sites = 50u RIght Splenius Capitus 25 units into 1 site Left trap 25 U x 2=50u All injections done after negative drawback for blood. Patient tolerated procedure well. Post procedure instructions given. Follow up appointment made in 61mo for re injection

## 2024-04-22 ENCOUNTER — Encounter: Payer: Medicare HMO | Admitting: Physical Medicine & Rehabilitation

## 2024-05-14 ENCOUNTER — Encounter: Attending: Physical Medicine & Rehabilitation | Admitting: Physical Medicine & Rehabilitation

## 2024-05-14 ENCOUNTER — Encounter: Payer: Self-pay | Admitting: Physical Medicine & Rehabilitation

## 2024-05-14 VITALS — BP 137/88 | HR 74 | Ht 67.0 in | Wt 241.6 lb

## 2024-05-14 DIAGNOSIS — G243 Spasmodic torticollis: Secondary | ICD-10-CM | POA: Diagnosis not present

## 2024-05-14 MED ORDER — SODIUM CHLORIDE (PF) 0.9 % IJ SOLN
4.0000 mL | Freq: Once | INTRAMUSCULAR | Status: AC
  Administered 2024-05-14: 4 mL via INTRAVENOUS

## 2024-05-14 MED ORDER — ONABOTULINUMTOXINA 100 UNITS IJ SOLR
100.0000 [IU] | Freq: Once | INTRAMUSCULAR | Status: AC
  Administered 2024-05-14: 100 [IU] via INTRAMUSCULAR

## 2024-05-14 NOTE — Progress Notes (Signed)
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 Chronic cervical pain not relieved by other treatments.  Informed consent was obtained after describing risks and benefits of the procedure with the patient this included bleeding bruising and infection The patient elects to proceed and has given Written consent.  REMS form completed  Patient placed in a seated position A 27-gauge 1 inch needle electrode was used to guide the injection under EMG guidance.  Muscles and dosing: Total dose 200U Botox left splenius Capitis 25 units x 3=75u   Right Semispinalis capitus 25units x 2 sites = 50u RIght Splenius Capitus 25 units into 1 site Left trap 25 U x 2=50u All injections done after negative drawback for blood. Patient tolerated procedure well. Post procedure instructions given. Follow up appointment made in 61mo for re injection

## 2024-05-17 ENCOUNTER — Other Ambulatory Visit: Payer: Self-pay

## 2024-09-14 ENCOUNTER — Encounter: Admitting: Physical Medicine & Rehabilitation

## 2024-10-19 ENCOUNTER — Encounter: Payer: Self-pay | Admitting: Physical Medicine & Rehabilitation

## 2024-10-19 ENCOUNTER — Encounter: Attending: Physical Medicine & Rehabilitation | Admitting: Physical Medicine & Rehabilitation

## 2024-10-19 VITALS — BP 155/103 | HR 88 | Ht 67.0 in | Wt 241.0 lb

## 2024-10-19 DIAGNOSIS — G243 Spasmodic torticollis: Secondary | ICD-10-CM

## 2024-10-19 MED ORDER — SODIUM CHLORIDE (PF) 0.9 % IJ SOLN
4.0000 mL | Freq: Once | INTRAMUSCULAR | Status: AC
  Administered 2024-10-19: 13:00:00 4 mL via INTRAVENOUS

## 2024-10-19 MED ORDER — ONABOTULINUMTOXINA 100 UNITS IJ SOLR
200.0000 [IU] | Freq: Once | INTRAMUSCULAR | Status: AC
  Administered 2024-10-19: 13:00:00 200 [IU] via INTRAMUSCULAR

## 2024-10-19 NOTE — Progress Notes (Signed)
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 Chronic cervical pain not relieved by other treatments.  Informed consent was obtained after describing risks and benefits of the procedure with the patient this included bleeding bruising and infection The patient elects to proceed and has given Written consent.  REMS form completed  Patient placed in a seated position A 27-gauge 1 inch needle electrode was used to guide the injection under EMG guidance.  Muscles and dosing: Total dose 200U Botox left splenius Capitis 25 units x 3=75u   Right Semispinalis capitus 25units x 2 sites = 50u RIght Splenius Capitus 25 units into 1 site Left trap 25 U x 2=50u All injections done after negative drawback for blood. Patient tolerated procedure well. Post procedure instructions given. Follow up appointment made in 61mo for re injection

## 2025-02-24 ENCOUNTER — Encounter: Admitting: Physical Medicine & Rehabilitation
